# Patient Record
Sex: Female | Born: 1985 | Race: Black or African American | Hispanic: No | Marital: Single | Smoking: Never smoker
Health system: Southern US, Community
[De-identification: ages and names within clinical notes are randomized; demographics above are authoritative.]

## PROBLEM LIST (undated history)

## (undated) ENCOUNTER — Inpatient Hospital Stay (HOSPITAL_COMMUNITY): Payer: Self-pay

## (undated) DIAGNOSIS — D649 Anemia, unspecified: Secondary | ICD-10-CM

## (undated) DIAGNOSIS — F32A Depression, unspecified: Secondary | ICD-10-CM

## (undated) DIAGNOSIS — Z789 Other specified health status: Secondary | ICD-10-CM

## (undated) DIAGNOSIS — K8020 Calculus of gallbladder without cholecystitis without obstruction: Secondary | ICD-10-CM

## (undated) DIAGNOSIS — R87619 Unspecified abnormal cytological findings in specimens from cervix uteri: Secondary | ICD-10-CM

## (undated) DIAGNOSIS — G43909 Migraine, unspecified, not intractable, without status migrainosus: Secondary | ICD-10-CM

## (undated) DIAGNOSIS — F419 Anxiety disorder, unspecified: Secondary | ICD-10-CM

## (undated) HISTORY — DX: Depression, unspecified: F32.A

## (undated) HISTORY — PX: WISDOM TOOTH EXTRACTION: SHX21

## (undated) HISTORY — DX: Unspecified abnormal cytological findings in specimens from cervix uteri: R87.619

## (undated) HISTORY — PX: THYROIDECTOMY, PARTIAL: SHX18

## (undated) HISTORY — DX: Anxiety disorder, unspecified: F41.9

## (undated) HISTORY — PX: GASTRIC BYPASS: SHX52

## (undated) HISTORY — DX: Migraine, unspecified, not intractable, without status migrainosus: G43.909

## (undated) HISTORY — DX: Anemia, unspecified: D64.9

## (undated) HISTORY — PX: DILATION AND CURETTAGE OF UTERUS: SHX78

---

## 2004-02-18 ENCOUNTER — Emergency Department: Payer: Self-pay | Admitting: Emergency Medicine

## 2004-03-26 ENCOUNTER — Emergency Department: Payer: Self-pay | Admitting: Emergency Medicine

## 2004-03-27 ENCOUNTER — Ambulatory Visit: Payer: Self-pay | Admitting: Emergency Medicine

## 2004-08-05 ENCOUNTER — Emergency Department: Payer: Self-pay | Admitting: Emergency Medicine

## 2004-09-12 ENCOUNTER — Emergency Department: Payer: Self-pay | Admitting: General Practice

## 2004-10-21 ENCOUNTER — Emergency Department: Payer: Self-pay | Admitting: Emergency Medicine

## 2005-02-09 ENCOUNTER — Emergency Department: Payer: Self-pay | Admitting: Emergency Medicine

## 2005-10-11 ENCOUNTER — Emergency Department: Payer: Self-pay | Admitting: Emergency Medicine

## 2005-12-10 ENCOUNTER — Emergency Department: Payer: Self-pay | Admitting: Emergency Medicine

## 2006-01-06 ENCOUNTER — Observation Stay: Payer: Self-pay | Admitting: Obstetrics and Gynecology

## 2006-01-07 ENCOUNTER — Observation Stay: Payer: Self-pay

## 2006-01-19 ENCOUNTER — Ambulatory Visit: Payer: Self-pay

## 2006-02-16 ENCOUNTER — Ambulatory Visit: Payer: Self-pay | Admitting: Otolaryngology

## 2006-05-20 ENCOUNTER — Observation Stay: Payer: Self-pay

## 2006-05-25 ENCOUNTER — Observation Stay: Payer: Self-pay | Admitting: Obstetrics and Gynecology

## 2006-05-26 ENCOUNTER — Ambulatory Visit: Payer: Self-pay | Admitting: *Deleted

## 2006-05-26 ENCOUNTER — Inpatient Hospital Stay: Payer: Self-pay | Admitting: Certified Nurse Midwife

## 2006-05-26 ENCOUNTER — Inpatient Hospital Stay (HOSPITAL_COMMUNITY): Admission: AD | Admit: 2006-05-26 | Discharge: 2006-05-26 | Payer: Self-pay | Admitting: Obstetrics and Gynecology

## 2006-10-07 ENCOUNTER — Ambulatory Visit: Payer: Self-pay | Admitting: Otolaryngology

## 2006-10-13 ENCOUNTER — Emergency Department: Payer: Self-pay

## 2006-12-05 ENCOUNTER — Emergency Department: Payer: Self-pay | Admitting: Emergency Medicine

## 2007-05-15 ENCOUNTER — Emergency Department: Payer: Self-pay | Admitting: Internal Medicine

## 2007-09-09 ENCOUNTER — Emergency Department (HOSPITAL_COMMUNITY): Admission: EM | Admit: 2007-09-09 | Discharge: 2007-09-10 | Payer: Self-pay | Admitting: Emergency Medicine

## 2007-11-01 ENCOUNTER — Emergency Department (HOSPITAL_COMMUNITY): Admission: EM | Admit: 2007-11-01 | Discharge: 2007-11-01 | Payer: Self-pay | Admitting: Emergency Medicine

## 2007-11-12 ENCOUNTER — Emergency Department (HOSPITAL_COMMUNITY): Admission: EM | Admit: 2007-11-12 | Discharge: 2007-11-12 | Payer: Self-pay | Admitting: Emergency Medicine

## 2008-10-31 ENCOUNTER — Emergency Department (HOSPITAL_COMMUNITY): Admission: EM | Admit: 2008-10-31 | Discharge: 2008-10-31 | Payer: Self-pay | Admitting: Emergency Medicine

## 2010-08-11 ENCOUNTER — Inpatient Hospital Stay (HOSPITAL_COMMUNITY)
Admission: AD | Admit: 2010-08-11 | Discharge: 2010-08-11 | Disposition: A | Discharge status: Home / Self Care | Payer: Self-pay | Source: Ambulatory Visit | Attending: Obstetrics & Gynecology | Admitting: Obstetrics & Gynecology

## 2010-08-11 ENCOUNTER — Inpatient Hospital Stay (HOSPITAL_COMMUNITY): Payer: Self-pay

## 2010-08-11 DIAGNOSIS — B373 Candidiasis of vulva and vagina: Secondary | ICD-10-CM

## 2010-08-11 DIAGNOSIS — R109 Unspecified abdominal pain: Secondary | ICD-10-CM

## 2010-08-11 DIAGNOSIS — B3731 Acute candidiasis of vulva and vagina: Secondary | ICD-10-CM | POA: Insufficient documentation

## 2010-08-11 DIAGNOSIS — A5901 Trichomonal vulvovaginitis: Secondary | ICD-10-CM | POA: Insufficient documentation

## 2010-08-11 LAB — URINALYSIS, ROUTINE W REFLEX MICROSCOPIC
Bilirubin Urine: NEGATIVE
Glucose, UA: NEGATIVE mg/dL
Nitrite: NEGATIVE
Specific Gravity, Urine: 1.025 (ref 1.005–1.030)
pH: 6.5 (ref 5.0–8.0)

## 2010-08-11 LAB — URINE MICROSCOPIC-ADD ON

## 2010-08-11 LAB — WET PREP, GENITAL

## 2010-08-11 LAB — POCT PREGNANCY, URINE: Preg Test, Ur: NEGATIVE

## 2010-08-11 LAB — CBC
MCH: 29.7 pg (ref 26.0–34.0)
MCHC: 34.5 g/dL (ref 30.0–36.0)
Platelets: 179 10*3/uL (ref 150–400)
RBC: 4.48 MIL/uL (ref 3.87–5.11)

## 2010-08-21 ENCOUNTER — Inpatient Hospital Stay (INDEPENDENT_AMBULATORY_CARE_PROVIDER_SITE_OTHER)
Admission: RE | Admit: 2010-08-21 | Discharge: 2010-08-21 | Disposition: A | Discharge status: Home / Self Care | Payer: Self-pay | Source: Ambulatory Visit

## 2010-08-21 DIAGNOSIS — R197 Diarrhea, unspecified: Secondary | ICD-10-CM

## 2010-08-21 LAB — POCT URINALYSIS DIP (DEVICE)
Bilirubin Urine: NEGATIVE
Ketones, ur: NEGATIVE mg/dL
pH: 5.5 (ref 5.0–8.0)

## 2010-12-10 LAB — GC/CHLAMYDIA PROBE AMP, GENITAL
Chlamydia, DNA Probe: NEGATIVE
GC Probe Amp, Genital: NEGATIVE

## 2010-12-10 LAB — WET PREP, GENITAL
Trich, Wet Prep: NONE SEEN
Yeast Wet Prep HPF POC: NONE SEEN

## 2010-12-10 LAB — POCT URINALYSIS DIP (DEVICE)
Glucose, UA: NEGATIVE
Ketones, ur: NEGATIVE
Operator id: 282151
Specific Gravity, Urine: 1.02

## 2011-05-19 ENCOUNTER — Encounter (HOSPITAL_COMMUNITY): Payer: Self-pay | Admitting: Emergency Medicine

## 2011-05-19 ENCOUNTER — Emergency Department (HOSPITAL_COMMUNITY)
Admission: EM | Admit: 2011-05-19 | Discharge: 2011-05-20 | Disposition: A | Discharge status: Home / Self Care | Payer: Medicaid Other | Attending: Emergency Medicine | Admitting: Emergency Medicine

## 2011-05-19 DIAGNOSIS — R109 Unspecified abdominal pain: Secondary | ICD-10-CM | POA: Insufficient documentation

## 2011-05-19 DIAGNOSIS — R110 Nausea: Secondary | ICD-10-CM | POA: Insufficient documentation

## 2011-05-19 DIAGNOSIS — R51 Headache: Secondary | ICD-10-CM | POA: Insufficient documentation

## 2011-05-19 NOTE — ED Notes (Signed)
PT. REPORTS HEADACHE, BODY ACHES WITH NAUSEA AND CHILLS ONSET TODAY .

## 2011-05-19 NOTE — ED Notes (Signed)
Called in all waiting rooms for patient, no answer.

## 2011-05-20 LAB — URINALYSIS, ROUTINE W REFLEX MICROSCOPIC
Glucose, UA: NEGATIVE mg/dL
Hgb urine dipstick: NEGATIVE
Ketones, ur: 15 mg/dL — AB
Protein, ur: NEGATIVE mg/dL

## 2011-05-20 MED ORDER — DIPHENHYDRAMINE HCL 50 MG/ML IJ SOLN
25.0000 mg | Freq: Once | INTRAMUSCULAR | Status: AC
  Administered 2011-05-20: 25 mg via INTRAVENOUS
  Filled 2011-05-20: qty 1

## 2011-05-20 MED ORDER — DEXAMETHASONE SODIUM PHOSPHATE 10 MG/ML IJ SOLN
10.0000 mg | Freq: Once | INTRAMUSCULAR | Status: AC
  Administered 2011-05-20: 10 mg via INTRAVENOUS
  Filled 2011-05-20: qty 1

## 2011-05-20 MED ORDER — METOCLOPRAMIDE HCL 5 MG/ML IJ SOLN: 10.0000 mg | Freq: Once | INTRAMUSCULAR | Status: DC

## 2011-05-20 NOTE — ED Notes (Signed)
Decadron 10 mg iv.  Headache almost gone before the  Med finished

## 2011-05-20 NOTE — Discharge Instructions (Signed)
You were seen and evaluated today for your symptoms of headache. You were treated for your headache with medications and IV fluids. You reported having good improvement of your symptoms at this time your providers that he may return home. Return to the emergency room if you develop return of symptoms, worsening headache, fever, chills or body aches.  Headache, General, Unknown Cause The specific cause of your headache may not have been found today. There are many causes and types of headache. A few common ones are:  Tension headache.   Migraine.   Infections (examples: dental and sinus infections).   Bone and/or joint problems in the neck or jaw.   Depression.   Eye problems.  These headaches are not life threatening.  Headaches can sometimes be diagnosed by a patient history and a physical exam. Sometimes, lab and imaging studies (such as x-ray and/or CT scan) are used to rule out more serious problems. In some cases, a spinal tap (lumbar puncture) may be requested. There are many times when your exam and tests may be normal on the first visit even when there is a serious problem causing your headaches. Because of that, it is very important to follow up with your doctor or local clinic for further evaluation. FINDING OUT THE RESULTS OF TESTS  If a radiology test was performed, a radiologist will review your results.   You will be contacted by the emergency department or your physician if any test results require a change in your treatment plan.   Not all test results may be available during your visit. If your test results are not back during the visit, make an appointment with your caregiver to find out the results. Do not assume everything is normal if you have not heard from your caregiver or the medical facility. It is important for you to follow up on all of your test results.  HOME CARE INSTRUCTIONS   Keep follow-up appointments with your caregiver, or any specialist referral.    Only take over-the-counter or prescription medicines for pain, discomfort, or fever as directed by your caregiver.   Biofeedback, massage, or other relaxation techniques may be helpful.   Ice packs or heat applied to the head and neck can be used. Do this three to four times per day, or as needed.   Call your doctor if you have any questions or concerns.   If you smoke, you should quit.  SEEK MEDICAL CARE IF:   You develop problems with medications prescribed.   You do not respond to or obtain relief from medications.   You have a change from the usual headache.   You develop nausea or vomiting.  SEEK IMMEDIATE MEDICAL CARE IF:   If your headache becomes severe.   You have an unexplained oral temperature above 102 F (38.9 C), or as your caregiver suggests.   You have a stiff neck.   You have loss of vision.   You have muscular weakness.   You have loss of muscular control.   You develop severe symptoms different from your first symptoms.   You start losing your balance or have trouble walking.   You feel faint or pass out.  MAKE SURE YOU:   Understand these instructions.   Will watch your condition.   Will get help right away if you are not doing well or get worse.  Document Released: 02/23/2005 Document Revised: 02/12/2011 Document Reviewed: 10/13/2007 [facility] Patient Information 2012 [place], [place].   RESOURCE GUIDE  Dental  Problems  Patients with Medicaid: [facility] [phone] [address]                                           [phone] [address] Phone:  [phone]                                                  Phone:  [phone]  If unable to pay or uninsured, contact:  [facility] or [facility] to become qualified for the adult dental clinic.  Chronic Pain Problems Contact [facility]  [phone] Patients need to be referred by their primary care  doctor.  Insufficient Money for Medicine Contact [employer]:  call "211" or [facility] 205-061-3189.  No Primary Care Doctor Call [facility]  ([phone] Other agencies that provide inexpensive medical care    [facility]  [phone]    [facility]  [phone]    [facility]  [phone]    [facility]  [phone]    [facility]  [phone]    [facility]  [phone]  Psychological Services [facility]  [phone] [facility]  [phone] [facility] Mental [facility]   [phone] (emergency services ([phone])  Substance Abuse Resources [facility]  [phone] [facility] (772)[phone] [facility] [phone] Floydene Flock (218) [phone] Residential & Outpatient Substance Abuse Program  518-807-1105  Abuse/Neglect [facility] Child Abuse Hotline (480) 213-3158 [facility] Child Abuse Hotline 534 641 4565 (After Hours)  Emergency Shelter [facility] 501-542-9705  Maternity Homes Room at the [place] [facility] (406)[phone] [facility] [phone]  MRSA Hotline #:   908-028-3249    [facility] Resources  [facility]     [employer]                          [facility] Dept. [address] [place]                       [address]      [address]  Blondell Reveal Phone:  [phone]                                   Phone:  [phone]                 Phone:  [phone]  [facility] Mental Health Phone:  [phone]  [facility] Child  Abuse Hotline (336) 2267982412 [phone])[phone] (After Hours)

## 2011-05-20 NOTE — ED Notes (Signed)
Iv started # 22 angiocath inserted in the lt a-c nss wo.

## 2011-05-20 NOTE — ED Provider Notes (Signed)
Medical screening examination/treatment/procedure(s) were performed by non-physician practitioner and as supervising physician I was immediately available for consultation/collaboration.  Laraya Pestka, MD 05/20/11 0735 

## 2011-05-20 NOTE — ED Notes (Signed)
Iv removed

## 2011-05-20 NOTE — ED Notes (Signed)
Benadryl 25mg  given iv

## 2011-05-20 NOTE — ED Provider Notes (Signed)
History     CSN: [id number]  Arrival date & time 05/19/11  2240   First MD Initiated Contact with Patient 05/19/11 2352      Chief Complaint  Patient presents with  . Headache     HPI  History provided by the patient. Patient is a 26 year old female with history of migraine headaches who presents with complaints of similar migraine headache that began earlier today. Patient is in typical location of past migraines on the bilateral top part of the head. Headache is made worse with bright lights. SHe has had associated with some nausea and slight abdominal ache. Patient denies having any fever or sweats. She denies any vomiting or diarrhea episodes. She denies any cough, nasal congestion, sinus pressure or sore throat. He denies any neck stiffness, rash or confusion. Symptoms are described as moderate to severe at times. SHe denies any other aggravating or alleviating factors.    History reviewed. No pertinent past medical history.  Past Surgical History  Procedure Date  . Thyroidectomy, partial     No family history on file.  History  Substance Use Topics  . Smoking status: Never Smoker   . Smokeless tobacco: Not on file  . Alcohol Use: Yes    OB History    Grav Para Term Preterm Abortions TAB SAB Ect Mult Living                  Review of Systems  Constitutional: Positive for chills. Negative for fever and diaphoresis.  HENT: Negative for congestion and sore throat.   Gastrointestinal: Positive for nausea and abdominal pain. Negative for vomiting, diarrhea and constipation.  Genitourinary: Negative for dysuria, frequency, hematuria, flank pain, vaginal bleeding and vaginal discharge.  Musculoskeletal: Positive for myalgias.  Neurological: Positive for headaches.  All other systems reviewed and are negative.    Allergies  Review of patient's allergies indicates no known allergies.  Home Medications   Current Outpatient Rx  Name Route Sig Dispense Refill  .  ACETAMINOPHEN 500 MG PO TABS Oral Take 500 mg by mouth every 6 (six) hours as needed. For pain      BP 141/89  Pulse 124  Temp(Src) 99.4 F (37.4 C) (Oral)  Resp 20  SpO2 100%  Physical Exam  Nursing note and vitals reviewed. Constitutional: She is oriented to person, place, and time. She appears well-developed and well-nourished. No distress.  HENT:  Head: Normocephalic.  Cardiovascular: Normal rate and regular rhythm.   Pulmonary/Chest: Effort normal and breath sounds normal. No respiratory distress. She has no wheezes. She has no rales.  Abdominal: Soft. She exhibits no distension. There is tenderness in the suprapubic area. There is no rigidity, no rebound, no guarding and no tenderness at McBurney's point.  Neurological: She is alert and oriented to person, place, and time. No cranial nerve deficit or sensory deficit. Gait normal.  Skin: Skin is warm and dry. No rash noted.  Psychiatric: She has a normal mood and affect. Her behavior is normal.    ED Course  Procedures   Results for orders placed during the hospital encounter of 05/19/11  POCT PREGNANCY, URINE      Component Value Range   Preg Test, Ur NEGATIVE  NEGATIVE   URINALYSIS, ROUTINE W REFLEX MICROSCOPIC      Component Value Range   Color, Urine YELLOW  YELLOW    APPearance CLEAR  CLEAR    Specific Gravity, Urine 1.024  1.005 - 1.030    pH 7.0  5.0 - 8.0    Glucose, UA NEGATIVE  NEGATIVE (mg/dL)   Hgb urine dipstick NEGATIVE  NEGATIVE    Bilirubin Urine NEGATIVE  NEGATIVE    Ketones, ur 15 (*) NEGATIVE (mg/dL)   Protein, ur NEGATIVE  NEGATIVE (mg/dL)   Urobilinogen, UA 1.0  0.0 - 1.0 (mg/dL)   Nitrite NEGATIVE  NEGATIVE    Leukocytes, UA NEGATIVE  NEGATIVE        1. Headache       MDM  12:00AM patient seen and evaluated. Patient in no acute distress.  Patients heart rate has improved with fluids. She also reports decrease pain which I suspect is also help with heart rate.  Patient with normal  UA and pregnancy. Patient continues to feel well and requesting to return home at this time. Patient given strict return precautions.      Angus Seller, [place] 05/20/11 [phone]

## 2013-10-24 ENCOUNTER — Other Ambulatory Visit: Payer: Self-pay | Admitting: Family

## 2013-10-24 ENCOUNTER — Ambulatory Visit
Admission: RE | Admit: 2013-10-24 | Discharge: 2013-10-24 | Disposition: A | Discharge status: Home / Self Care | Payer: Self-pay | Source: Ambulatory Visit | Attending: Family | Admitting: Family

## 2013-10-24 ENCOUNTER — Encounter (INDEPENDENT_AMBULATORY_CARE_PROVIDER_SITE_OTHER): Payer: Self-pay

## 2013-10-24 DIAGNOSIS — R0602 Shortness of breath: Secondary | ICD-10-CM

## 2013-12-14 ENCOUNTER — Emergency Department (HOSPITAL_COMMUNITY)
Admission: EM | Admit: 2013-12-14 | Discharge: 2013-12-15 | Disposition: A | Discharge status: Home / Self Care | Payer: Medicaid Other | Attending: Emergency Medicine | Admitting: Emergency Medicine

## 2013-12-14 ENCOUNTER — Encounter (HOSPITAL_COMMUNITY): Payer: Self-pay | Admitting: Emergency Medicine

## 2013-12-14 DIAGNOSIS — Z3202 Encounter for pregnancy test, result negative: Secondary | ICD-10-CM | POA: Diagnosis not present

## 2013-12-14 DIAGNOSIS — L03116 Cellulitis of left lower limb: Secondary | ICD-10-CM | POA: Insufficient documentation

## 2013-12-14 DIAGNOSIS — L03115 Cellulitis of right lower limb: Secondary | ICD-10-CM

## 2013-12-14 DIAGNOSIS — B9689 Other specified bacterial agents as the cause of diseases classified elsewhere: Secondary | ICD-10-CM

## 2013-12-14 DIAGNOSIS — N760 Acute vaginitis: Secondary | ICD-10-CM

## 2013-12-14 DIAGNOSIS — R1030 Lower abdominal pain, unspecified: Secondary | ICD-10-CM | POA: Diagnosis present

## 2013-12-14 LAB — COMPREHENSIVE METABOLIC PANEL
ALT: 20 U/L (ref 0–35)
AST: 16 U/L (ref 0–37)
Albumin: 3.8 g/dL (ref 3.5–5.2)
Alkaline Phosphatase: 46 U/L (ref 39–117)
Anion gap: 10 (ref 5–15)
BILIRUBIN TOTAL: 0.3 mg/dL (ref 0.3–1.2)
BUN: 12 mg/dL (ref 6–23)
CO2: 29 meq/L (ref 19–32)
CREATININE: 0.98 mg/dL (ref 0.50–1.10)
Calcium: 9.5 mg/dL (ref 8.4–10.5)
Chloride: 101 mEq/L (ref 96–112)
GFR calc Af Amer: 89 mL/min — ABNORMAL LOW (ref >90)
GFR, EST NON AFRICAN AMERICAN: 77 mL/min — AB (ref >90)
GLUCOSE: 92 mg/dL (ref 70–99)
Potassium: 4.1 mEq/L (ref 3.7–5.3)
SODIUM: 140 meq/L (ref 137–147)
Total Protein: 7.3 g/dL (ref 6.0–8.3)

## 2013-12-14 LAB — CBC WITH DIFFERENTIAL/PLATELET
Basophils Absolute: 0 10*3/uL (ref 0.0–0.1)
Basophils Relative: 0 % (ref 0–1)
Eosinophils Absolute: 0.1 10*3/uL (ref 0.0–0.7)
Eosinophils Relative: 1 % (ref 0–5)
HEMATOCRIT: 41.3 % (ref 36.0–46.0)
Hemoglobin: 13.8 g/dL (ref 12.0–15.0)
LYMPHS ABS: 3.3 10*3/uL (ref 0.7–4.0)
LYMPHS PCT: 34 % (ref 12–46)
MCH: 29.2 pg (ref 26.0–34.0)
MCHC: 33.4 g/dL (ref 30.0–36.0)
MCV: 87.5 fL (ref 78.0–100.0)
MONO ABS: 0.6 10*3/uL (ref 0.1–1.0)
Monocytes Relative: 6 % (ref 3–12)
Neutro Abs: 5.6 10*3/uL (ref 1.7–7.7)
Neutrophils Relative %: 59 % (ref 43–77)
Platelets: 215 10*3/uL (ref 150–400)
RBC: 4.72 MIL/uL (ref 3.87–5.11)
RDW: 12.4 % (ref 11.5–15.5)
WBC: 9.6 10*3/uL (ref 4.0–10.5)

## 2013-12-14 LAB — LIPASE, BLOOD: Lipase: 31 U/L (ref 11–59)

## 2013-12-14 NOTE — ED Notes (Signed)
Pt states she is having lower abd pain that started yesterday  Pt describes the pain as a pressure  Pt states she is nauseated and had vomiting yesterday but only dry heaves today  Denies urinary or GYN problems  Pt states she also has a rash on her right leg that she noticed on Wednesday

## 2013-12-15 LAB — URINALYSIS, ROUTINE W REFLEX MICROSCOPIC
BILIRUBIN URINE: NEGATIVE
Glucose, UA: NEGATIVE mg/dL
HGB URINE DIPSTICK: NEGATIVE
Ketones, ur: NEGATIVE mg/dL
Leukocytes, UA: NEGATIVE
Nitrite: NEGATIVE
PH: 5 (ref 5.0–8.0)
Protein, ur: NEGATIVE mg/dL
Specific Gravity, Urine: 1.021 (ref 1.005–1.030)
Urobilinogen, UA: 0.2 mg/dL (ref 0.0–1.0)

## 2013-12-15 LAB — POC URINE PREG, ED: Preg Test, Ur: NEGATIVE

## 2013-12-15 LAB — WET PREP, GENITAL
TRICH WET PREP: NONE SEEN
Yeast Wet Prep HPF POC: NONE SEEN

## 2013-12-15 MED ORDER — ACETAMINOPHEN 500 MG PO TABS
1000.0000 mg | ORAL_TABLET | Freq: Once | ORAL | Status: AC
  Administered 2013-12-15: 1000 mg via ORAL
  Filled 2013-12-15: qty 2

## 2013-12-15 MED ORDER — METRONIDAZOLE 500 MG PO TABS: 500.0000 mg | ORAL_TABLET | Freq: Two times a day (BID) | ORAL | Status: DC

## 2013-12-15 MED ORDER — SULFAMETHOXAZOLE-TRIMETHOPRIM 800-160 MG PO TABS: 1.0000 | ORAL_TABLET | Freq: Two times a day (BID) | ORAL | Status: AC

## 2013-12-15 MED ORDER — IBUPROFEN 800 MG PO TABS: 800.0000 mg | ORAL_TABLET | Freq: Three times a day (TID) | ORAL | Status: DC | PRN

## 2013-12-15 NOTE — ED Provider Notes (Signed)
TIME SEEN: 1:40 AM  CHIEF COMPLAINT: Abdominal pain, nausea and vomiting, rash  HPI: Patient is a 28 y.o. F G2P1A1 with no significant past medical history who presents emergency room with a lower abdominal pressure that started yesterday. She also describes the pain as sharp without radiation. Aggravating or leading factors. She did have nausea and vomiting yesterday. No diarrhea. No vaginal bleeding or discharge. Last menstrual period was several months ago but she does have a Mirena IUD. She is sexually active with one female partner. She has had a history of Chlamydia when she was in high school that was treated.    She is also complaining of a rash to her right lateral thigh that she noticed yesterday. No history of new exposures to soaps or lotions or detergents. No new medications. It is not pruritic. Denies any insect bite to this area. No other rash.  ROS: See HPI Constitutional: no fever  Eyes: no drainage  ENT: no runny nose   Cardiovascular:  no chest pain  Resp: no SOB  GI: no vomiting GU: no dysuria Integumentary: no rash  Allergy: no hives  Musculoskeletal: no leg swelling  Neurological: no slurred speech ROS otherwise negative  PAST MEDICAL HISTORY/PAST SURGICAL HISTORY:  History reviewed. No pertinent past medical history.  MEDICATIONS:  Prior to Admission medications   Medication Sig Start Date End Date Taking? Authorizing Provider  ibuprofen (ADVIL,MOTRIN) 200 MG tablet Take 400 mg by mouth every 6 (six) hours as needed for moderate pain.   Yes Historical Provider, MD  levonorgestrel (MIRENA) 20 MCG/24HR IUD 1 each by Intrauterine route once.    Historical Provider, MD    ALLERGIES:  No Known Allergies  SOCIAL HISTORY:  History  Substance Use Topics  . Smoking status: Never Smoker   . Smokeless tobacco: Not on file  . Alcohol Use: Yes     Comment: social    FAMILY HISTORY: Family History  Problem Relation Age of Onset  . Hypertension Other   .  Diabetes Other     EXAM: BP 131/88  Pulse 71  Temp(Src) 98.8 F (37.1 C) (Oral)  Resp 16  Ht 5\' 4"  (1.626 m)  Wt 230 lb (104.327 kg)  BMI 39.46 kg/m2  SpO2 100% CONSTITUTIONAL: Alert and oriented and responds appropriately to questions. Well-appearing; well-nourished, nontoxic, smiling, pleasant HEAD: Normocephalic EYES: Conjunctivae clear, PERRL ENT: normal nose; no rhinorrhea; moist mucous membranes; pharynx without lesions noted NECK: Supple, no meningismus, no LAD  CARD: RRR; S1 and S2 appreciated; no murmurs, no clicks, no rubs, no gallops RESP: Normal chest excursion without splinting or tachypnea; breath sounds clear and equal bilaterally; no wheezes, no rhonchi, no rales,  ABD/GI: Normal bowel sounds; non-distended; soft, non-tender, no rebound, no guarding GU:  Normal external genitalia, no vaginal bleeding, minimal amount of thin white vaginal discharge, no cervical motion tenderness, no adnexal tenderness or fullness BACK:  The back appears normal and is non-tender to palpation, there is no CVA tenderness EXT: Normal ROM in all joints; non-tender to palpation; no edema; normal capillary refill; no cyanosis    SKIN: Normal color for age and race; warm; 3 small papules without drainage with surrounding erythema and warmth without induration or fluctuance to the right lateral thigh NEURO: Moves all extremities equally PSYCH: The patient's mood and manner are appropriate. Grooming and personal hygiene are appropriate.  MEDICAL DECISION MAKING: Patient here with what appears to be cellulitis to her right lateral thigh.  She has no systemic symptoms other  than nausea and vomiting yesterday. He has not had any vomiting today. She is tolerating by mouth. Will treat with Bactrim for one week.   She is also complaining of lower abdominal pain. Her abdominal exam is benign. We'll perform pelvic exam with cultures. Urinalysis and urine pregnancy test pending. Low suspicion for  appendicitis , Torsion, ectopic, TOA.  UTI as possible. We'll give Tylenol for pain. She denies wanting antibiotics at this time.   ED PROGRESS: Patient's labs are completely normal. Urine shows no sign of infection. UPT negative. Pelvic exam unremarkable. Her wet prep is positive for clue cells. We'll treat with Flagyl for one week. I feel she is safe to be discharged home. Discussed return precautions. Patient verbalizes understanding and is comfortable with plan.     Matthews, DO 12/15/13 [phone]

## 2013-12-15 NOTE — Discharge Instructions (Signed)
Bacterial Vaginosis Bacterial vaginosis is a vaginal infection that occurs when the normal balance of bacteria in the vagina is disrupted. It results from an overgrowth of certain bacteria. This is the most common vaginal infection in women of childbearing age. Treatment is important to prevent complications, especially in pregnant women, as it can cause a premature delivery. CAUSES  Bacterial vaginosis is caused by an increase in harmful bacteria that are normally present in smaller amounts in the vagina. Several different kinds of bacteria can cause bacterial vaginosis. However, the reason that the condition develops is not fully understood. RISK FACTORS Certain activities or behaviors can put you at an increased risk of developing bacterial vaginosis, including:  Having a new sex partner or multiple sex partners.  Douching.  Using an intrauterine device (IUD) for contraception. Women do not get bacterial vaginosis from toilet seats, bedding, swimming pools, or contact with objects around them. SIGNS AND SYMPTOMS  Some women with bacterial vaginosis have no signs or symptoms. Common symptoms include:  Grey vaginal discharge.  A fishlike odor with discharge, especially after sexual intercourse.  Itching or burning of the vagina and vulva.  Burning or pain with urination. DIAGNOSIS  Your health care provider will take a medical history and examine the vagina for signs of bacterial vaginosis. A sample of vaginal fluid may be taken. Your health care provider will look at this sample under a microscope to check for bacteria and abnormal cells. A vaginal pH test may also be done.  TREATMENT  Bacterial vaginosis may be treated with antibiotic medicines. These may be given in the form of a pill or a vaginal cream. A second round of antibiotics may be prescribed if the condition comes back after treatment.  HOME CARE INSTRUCTIONS   Only take over-the-counter or prescription medicines as  directed by your health care provider.  If antibiotic medicine was prescribed, take it as directed. Make sure you finish it even if you start to feel better.  Do not have sex until treatment is completed.  Tell all sexual partners that you have a vaginal infection. They should see their health care provider and be treated if they have problems, such as a mild rash or itching.  Practice safe sex by using condoms and only having one sex partner. SEEK MEDICAL CARE IF:   Your symptoms are not improving after 3 days of treatment.  You have increased discharge or pain.  You have a fever. MAKE SURE YOU:   Understand these instructions.  Will watch your condition.  Will get help right away if you are not doing well or get worse. FOR MORE INFORMATION  Centers for Disease Control and Prevention, Division of STD Prevention: [employer]raud[employer] American Sexual Health Association (ASHA): www.ashastd.org  Document Released: 02/23/2005 Document Revised: 12/14/2012 Document Reviewed: 10/05/2012 [facility] Patient Information 2015 [place], [place]. This information is not intended to replace advice given to you by your health care provider. Make sure you discuss any questions you have with your health care provider.  Cellulitis Cellulitis is an infection of the skin and the tissue beneath it. The infected area is usually red and tender. Cellulitis occurs most often in the arms and lower legs.  CAUSES  Cellulitis is caused by bacteria that enter the skin through cracks or cuts in the skin. The most common types of bacteria that cause cellulitis are staphylococci and streptococci. SIGNS AND SYMPTOMS   Redness and warmth.  Swelling.  Tenderness or pain.  Fever. DIAGNOSIS  Your health care  provider can usually determine what is wrong based on a physical exam. Blood tests may also be done. TREATMENT  Treatment usually involves taking an antibiotic medicine. HOME CARE INSTRUCTIONS   Take your  antibiotic medicine as directed by your health care provider. Finish the antibiotic even if you start to feel better.  Keep the infected arm or leg elevated to reduce swelling.  Apply a warm cloth to the affected area up to 4 times per day to relieve pain.  Take medicines only as directed by your health care provider.  Keep all follow-up visits as directed by your health care provider. SEEK MEDICAL CARE IF:   You notice red streaks coming from the infected area.  Your red area gets larger or turns dark in color.  Your bone or joint underneath the infected area becomes painful after the skin has healed.  Your infection returns in the same area or another area.  You notice a swollen bump in the infected area.  You develop new symptoms.  You have a fever. SEEK IMMEDIATE MEDICAL CARE IF:   You feel very sleepy.  You develop vomiting or diarrhea.  You have a general ill feeling (malaise) with muscle aches and pains. MAKE SURE YOU:   Understand these instructions.  Will watch your condition.  Will get help right away if you are not doing well or get worse. Document Released: 12/03/2004 Document Revised: 07/10/2013 Document Reviewed: 05/11/2011 [facility] Patient Information 2015 [place], [place]. This information is not intended to replace advice given to you by your health care provider. Make sure you discuss any questions you have with your health care provider.

## 2013-12-16 LAB — GC/CHLAMYDIA PROBE AMP
CT PROBE, AMP APTIMA: NEGATIVE
GC PROBE AMP APTIMA: NEGATIVE

## 2013-12-23 ENCOUNTER — Emergency Department (HOSPITAL_COMMUNITY)
Admission: EM | Admit: 2013-12-23 | Discharge: 2013-12-23 | Disposition: A | Discharge status: Home / Self Care | Payer: Medicaid Other | Source: Home / Self Care | Attending: Family Medicine | Admitting: Family Medicine

## 2013-12-23 ENCOUNTER — Encounter (HOSPITAL_COMMUNITY): Payer: Self-pay | Admitting: Emergency Medicine

## 2013-12-23 DIAGNOSIS — T7840XA Allergy, unspecified, initial encounter: Secondary | ICD-10-CM

## 2013-12-23 DIAGNOSIS — Z889 Allergy status to unspecified drugs, medicaments and biological substances status: Secondary | ICD-10-CM

## 2013-12-23 MED ORDER — TRIAMCINOLONE ACETONIDE 40 MG/ML IJ SUSP
40.0000 mg | Freq: Once | INTRAMUSCULAR | Status: AC
  Administered 2013-12-23: 40 mg via INTRAMUSCULAR

## 2013-12-23 MED ORDER — TRIAMCINOLONE ACETONIDE 40 MG/ML IJ SUSP
INTRAMUSCULAR | Status: AC
  Filled 2013-12-23: qty 1

## 2013-12-23 NOTE — Discharge Instructions (Signed)
No more sulfa medicine, use benadryl for rash and itching, return to ER if further problems or see your doctor.

## 2013-12-23 NOTE — ED Notes (Signed)
Pt   Seen    Er    9  Days  Ago  For  BV    And cellulitis       Present  Today  With  A rash  Red  Raised  Lesions       That  Started    yest          No  Angioedema        Sitting  Upright on  Exam table       Speaking  In   Complete  sentances  Appearing no  Acute  Distress

## 2013-12-23 NOTE — ED Provider Notes (Signed)
CSN: [id number]     Arrival date & time 12/23/13  1508 History   First MD Initiated Contact with Patient 12/23/13 1531     Chief Complaint  Patient presents with  . Rash   (Consider location/radiation/quality/duration/timing/severity/associated sxs/prior Treatment) Patient is a 28 y.o. female presenting with rash. The history is provided by the patient.  Rash Location:  Full body Quality: dryness, itchiness and redness   Severity:  Mild Onset quality:  Sudden Duration:  1 day Progression:  Spreading Chronicity:  New Context: medications   Context comment:  Has been on septra and metronidazole for 1 weeks for bv and skin cellulitis, rash and itching today.all over. Relieved by:  None tried Worsened by:  Nothing tried Ineffective treatments:  None tried Associated symptoms: no fever, no nausea, no shortness of breath, no throat swelling, no tongue swelling and not wheezing     No past medical history on file. Past Surgical History  Procedure Laterality Date  . Thyroidectomy, partial     Family History  Problem Relation Age of Onset  . Hypertension Other   . Diabetes Other    History  Substance Use Topics  . Smoking status: Never Smoker   . Smokeless tobacco: Not on file  . Alcohol Use: Yes     Comment: social   OB History   Grav Para Term Preterm Abortions TAB SAB Ect Mult Living                 Review of Systems  Constitutional: Negative.  Negative for fever.  Respiratory: Negative for shortness of breath and wheezing.   Cardiovascular: Negative for palpitations and leg swelling.  Gastrointestinal: Negative for nausea.  Skin: Positive for rash.    Allergies  Review of patient's allergies indicates no known allergies.  Home Medications   Prior to Admission medications   Medication Sig Start Date End Date Taking? Authorizing Provider  Sulfamethoxazole-Trimethoprim (SEPTRA PO) Take by mouth.   Yes Historical Provider, MD  ibuprofen (ADVIL,MOTRIN) 200 MG  tablet Take 400 mg by mouth every 6 (six) hours as needed for moderate pain.    Historical Provider, MD  ibuprofen (ADVIL,MOTRIN) 800 MG tablet Take 1 tablet (800 mg total) by mouth every 8 (eight) hours as needed for mild pain. 12/15/13   Kristen N Ward, DO  levonorgestrel (MIRENA) 20 MCG/24HR IUD 1 each by Intrauterine route once.    Historical Provider, MD  metroNIDAZOLE (FLAGYL) 500 MG tablet Take 1 tablet (500 mg total) by mouth 2 (two) times daily. Do not drink alcohol with this medication. 12/15/13   Kristen N Ward, DO   BP 146/85  Pulse 76  Temp(Src) 98.2 F (36.8 C) (Oral)  Resp 18  SpO2 97% Physical Exam  Nursing note and vitals reviewed. Constitutional: She is oriented to person, place, and time. She appears well-developed and well-nourished. No distress.  HENT:  Mouth/Throat: Oropharynx is clear and moist.  Neck: Normal range of motion.  Cardiovascular: Regular rhythm, normal heart sounds and intact distal pulses.   Pulmonary/Chest: Effort normal and breath sounds normal. She has no wheezes. She has no rales.  Neurological: She is alert and oriented to person, place, and time.  Skin: Skin is warm and dry.  Pruritis and diffuse hives.    ED Course  Procedures (including critical care time) Labs Review Labs Reviewed - No data to display  Imaging Review No results found.   MDM   1. Allergic reaction caused by a drug  Billy Fischer, MD 12/23/13 [phone]

## 2014-01-01 ENCOUNTER — Encounter (HOSPITAL_COMMUNITY): Payer: Self-pay

## 2014-01-01 ENCOUNTER — Inpatient Hospital Stay (HOSPITAL_COMMUNITY)
Admission: AD | Admit: 2014-01-01 | Discharge: 2014-01-02 | Disposition: A | Discharge status: Home / Self Care | Payer: Medicaid Other | Source: Ambulatory Visit | Attending: Obstetrics and Gynecology | Admitting: Obstetrics and Gynecology

## 2014-01-01 DIAGNOSIS — N898 Other specified noninflammatory disorders of vagina: Secondary | ICD-10-CM | POA: Diagnosis present

## 2014-01-01 LAB — URINALYSIS, ROUTINE W REFLEX MICROSCOPIC
BILIRUBIN URINE: NEGATIVE
Glucose, UA: NEGATIVE mg/dL
Hgb urine dipstick: NEGATIVE
KETONES UR: NEGATIVE mg/dL
LEUKOCYTES UA: NEGATIVE
NITRITE: NEGATIVE
PROTEIN: NEGATIVE mg/dL
Specific Gravity, Urine: 1.025 (ref 1.005–1.030)
UROBILINOGEN UA: 0.2 mg/dL (ref 0.0–1.0)
pH: 6.5 (ref 5.0–8.0)

## 2014-01-01 LAB — POCT PREGNANCY, URINE: Preg Test, Ur: NEGATIVE

## 2014-01-01 NOTE — MAU Note (Signed)
Having milky vaginal discharge and vaginal itching since Sunday.  Was recently treated for bacterial vaginosis and had to take an antibiotic for a spider bite.  Had to stop taking both meds due to hives.

## 2014-01-02 DIAGNOSIS — N898 Other specified noninflammatory disorders of vagina: Secondary | ICD-10-CM

## 2014-01-02 LAB — WET PREP, GENITAL
Clue Cells Wet Prep HPF POC: NONE SEEN
Trich, Wet Prep: NONE SEEN
WBC, Wet Prep HPF POC: NONE SEEN
Yeast Wet Prep HPF POC: NONE SEEN

## 2014-01-02 NOTE — MAU Provider Note (Signed)
History     CSN: [id number]  Arrival date and time: 01/01/14 2310   First Provider Initiated Contact with Patient 01/02/14 0020      Chief Complaint  Patient presents with  . Vaginal Discharge   Vaginal Discharge The patient's primary symptoms include a vaginal discharge.   Dana Garcia is a 28 y.o. [id number] who presents today with vaginal discharge x 1 week. She states that it has also been itchy. She was recently treated for BV and a spider bite with antibiotics.   History reviewed. No pertinent past medical history.  Past Surgical History  Procedure Laterality Date  . Thyroidectomy, partial      Family History  Problem Relation Age of Onset  . Hypertension Other   . Diabetes Other     History  Substance Use Topics  . Smoking status: Never Smoker   . Smokeless tobacco: Never Used  . Alcohol Use: Yes     Comment: social    Allergies:  Allergies  Allergen Reactions  . Flagyl [Metronidazole] Hives  . Septra [Sulfamethoxazole-Trimethoprim] Hives    Prescriptions prior to admission  Medication Sig Dispense Refill  . ibuprofen (ADVIL,MOTRIN) 200 MG tablet Take 400 mg by mouth every 6 (six) hours as needed for moderate pain.      Marland Kitchen ibuprofen (ADVIL,MOTRIN) 800 MG tablet Take 1 tablet (800 mg total) by mouth every 8 (eight) hours as needed for mild pain.  30 tablet  0  . levonorgestrel (MIRENA) 20 MCG/24HR IUD 1 each by Intrauterine route once.      . metroNIDAZOLE (FLAGYL) 500 MG tablet Take 1 tablet (500 mg total) by mouth 2 (two) times daily. Do not drink alcohol with this medication.  14 tablet  0  . Sulfamethoxazole-Trimethoprim (SEPTRA PO) Take by mouth.        Review of Systems  Genitourinary: Positive for vaginal discharge.   Physical Exam   There were no vitals taken for this visit.  Physical Exam  Nursing note and vitals reviewed. Constitutional: She is oriented to person, place, and time. She appears well-developed and well-nourished. No  distress.  Cardiovascular: Normal rate.   Respiratory: Effort normal.  GI: Soft. There is no tenderness. There is no rebound.  Genitourinary:   External: no lesion Vagina: small amount of white discharge Cervix: pink, smooth, no CMT Uterus: NSSC Adnexa: NT   Neurological: She is alert and oriented to person, place, and time.  Skin: Skin is warm and dry.  Psychiatric: She has a normal mood and affect.    MAU Course  Procedures  Results for orders placed during the hospital encounter of 01/01/14 (from the past 24 hour(s))  URINALYSIS, ROUTINE W REFLEX MICROSCOPIC     Status: None   Collection Time    01/01/14 11:27 PM      Result Value Ref Range   Color, Urine YELLOW  YELLOW   APPearance CLEAR  CLEAR   Specific Gravity, Urine 1.025  1.005 - 1.030   pH 6.5  5.0 - 8.0   Glucose, UA NEGATIVE  NEGATIVE mg/dL   Hgb urine dipstick NEGATIVE  NEGATIVE   Bilirubin Urine NEGATIVE  NEGATIVE   Ketones, ur NEGATIVE  NEGATIVE mg/dL   Protein, ur NEGATIVE  NEGATIVE mg/dL   Urobilinogen, UA 0.2  0.0 - 1.0 mg/dL   Nitrite NEGATIVE  NEGATIVE   Leukocytes, UA NEGATIVE  NEGATIVE  POCT PREGNANCY, URINE     Status: None   Collection Time    01/01/14 11:46  PM      Result Value Ref Range   Preg Test, Ur NEGATIVE  NEGATIVE  WET PREP, GENITAL     Status: None   Collection Time    01/02/14 12:20 AM      Result Value Ref Range   Yeast Wet Prep HPF POC NONE SEEN  NONE SEEN   Trich, Wet Prep NONE SEEN  NONE SEEN   Clue Cells Wet Prep HPF POC NONE SEEN  NONE SEEN   WBC, Wet Prep HPF POC NONE SEEN  NONE SEEN     Assessment and Plan   1. Vaginal discharge    Recommend Gynecort OTC for itching  FU with the health department as needed  Follow-up Information   Follow up with [facility] [place]. (As needed)    Contact information:   [place] [id number] [phone]      Mathis Bud 01/02/2014, 12:29 AM

## 2014-01-02 NOTE — MAU Provider Note (Signed)
.  Attestation of Attending Supervision of Advanced Practitioner (CNM/NP): Evaluation and management procedures were performed by the Advanced Practitioner under my supervision and collaboration.  I have reviewed the Advanced Practitioner's note and chart, and I agree with the management and plan.  Desmond Tufano 01/02/2014 7:01 AM

## 2014-01-02 NOTE — Discharge Instructions (Signed)
May use over the counter Gynecort for itching

## 2014-01-08 ENCOUNTER — Encounter (HOSPITAL_COMMUNITY): Payer: Self-pay

## 2014-05-07 ENCOUNTER — Emergency Department (HOSPITAL_COMMUNITY)
Admission: EM | Admit: 2014-05-07 | Discharge: 2014-05-07 | Disposition: A | Discharge status: Home / Self Care | Payer: Medicaid Other | Attending: Emergency Medicine | Admitting: Emergency Medicine

## 2014-05-07 ENCOUNTER — Encounter (HOSPITAL_COMMUNITY): Payer: Self-pay | Admitting: Emergency Medicine

## 2014-05-07 ENCOUNTER — Emergency Department (HOSPITAL_COMMUNITY): Payer: Medicaid Other

## 2014-05-07 DIAGNOSIS — M79672 Pain in left foot: Secondary | ICD-10-CM

## 2014-05-07 DIAGNOSIS — M7989 Other specified soft tissue disorders: Secondary | ICD-10-CM | POA: Diagnosis not present

## 2014-05-07 DIAGNOSIS — Z791 Long term (current) use of non-steroidal anti-inflammatories (NSAID): Secondary | ICD-10-CM | POA: Diagnosis not present

## 2014-05-07 MED ORDER — IBUPROFEN 800 MG PO TABS
800.0000 mg | ORAL_TABLET | Freq: Once | ORAL | Status: AC
  Administered 2014-05-07: 800 mg via ORAL
  Filled 2014-05-07: qty 1

## 2014-05-07 MED ORDER — IBUPROFEN 800 MG PO TABS: 800.0000 mg | ORAL_TABLET | Freq: Three times a day (TID) | ORAL | Status: DC

## 2014-05-07 MED ORDER — TRAMADOL HCL 50 MG PO TABS: 50.0000 mg | ORAL_TABLET | Freq: Four times a day (QID) | ORAL | Status: DC | PRN

## 2014-05-07 NOTE — ED Provider Notes (Signed)
CSN: [id number]     Arrival date & time 05/07/14  1840 History  This chart was scribed for Charlann Lange, PA, working with Hoy Morn, MD by Starleen Arms, ED Scribe. This patient was seen in room WTR5/WTR5 and the patient's care was started at 8:37 PM.   Chief Complaint  Patient presents with  . Foot Pain   The history is provided by the patient. No language interpreter was used.   HPI Comments: Dana Garcia is a 29 y.o. female who presents to the Emergency Department complaining of worsening, throbbing left foot pain and swelling onset 2 days ago without injury while the patient was at work.  Patient has not taken any medications but has used ice/heat and elevation.  Patient reports she works at the [employer] and is on her feet for long stretches of time.  She denies having any new footwear.    History reviewed. No pertinent past medical history. Past Surgical History  Procedure Laterality Date  . Thyroidectomy, partial     Family History  Problem Relation Age of Onset  . Hypertension Other   . Diabetes Other    History  Substance Use Topics  . Smoking status: Never Smoker   . Smokeless tobacco: Never Used  . Alcohol Use: Yes     Comment: social   OB History    Gravida Para Term Preterm AB TAB SAB Ectopic Multiple Living   2 1 1  1 1    1      Review of Systems  Musculoskeletal: Positive for joint swelling and arthralgias.      Allergies  Flagyl and Septra  Home Medications   Prior to Admission medications   Medication Sig Start Date End Date Taking? Authorizing Provider  ibuprofen (ADVIL,MOTRIN) 200 MG tablet Take 400 mg by mouth every 6 (six) hours as needed for moderate pain.    Historical Provider, MD  ibuprofen (ADVIL,MOTRIN) 800 MG tablet Take 1 tablet (800 mg total) by mouth every 8 (eight) hours as needed for mild pain. 12/15/13   Kristen N Ward, DO  levonorgestrel (MIRENA) 20 MCG/24HR IUD 1 each by Intrauterine route once.    Historical  Provider, MD   BP 124/74 mmHg  Pulse 68  Temp(Src) 97.9 F (36.6 C) (Oral)  Resp 16  SpO2 100% Physical Exam  Constitutional: She is oriented to person, place, and time. She appears well-developed and well-nourished. No distress.  HENT:  Head: Normocephalic and atraumatic.  Eyes: Conjunctivae and EOM are normal.  Neck: Neck supple. No tracheal deviation present.  Cardiovascular: Normal rate.   Pulmonary/Chest: Effort normal. No respiratory distress.  Musculoskeletal: Normal range of motion.  Left foot is moderately swollen dorsally.  Tender over dorsal foot second through fifth metatarsals extending to lateral ankle.  Ankle joint is stable.  No calf tenderness.  DP pulse intact.   Neurological: She is alert and oriented to person, place, and time.  Skin: Skin is warm and dry.  Psychiatric: She has a normal mood and affect. Her behavior is normal.  Nursing note and vitals reviewed.   ED Course  Procedures (including critical care time)  DIAGNOSTIC STUDIES: Oxygen Saturation is 100% on RA, normal by my interpretation.    COORDINATION OF CARE:  8:39 PM Discussed treatment plan with patient at bedside.  Patient acknowledges and agrees with plan.    Labs Review Labs Reviewed - No data to display  Imaging Review No results found.   EKG Interpretation None  MDM   Final diagnoses:  None    1. Left foot pain  Soft tissue swelling/strain of left foot. Supportive management.   I personally performed the services described in this documentation, which was scribed in my presence. The recorded information has been reviewed and is accurate.     Charlann Lange, PA-C 05/14/14 [place], MD 05/16/14 [phone]

## 2014-05-07 NOTE — Discharge Instructions (Signed)
Cryotherapy °Cryotherapy means treatment with cold. Ice or gel packs can be used to reduce both pain and swelling. Ice is the most helpful within the first 24 to 48 hours after an injury or flare-up from overusing a muscle or joint. Sprains, strains, spasms, burning pain, shooting pain, and aches can all be eased with ice. Ice can also be used when recovering from surgery. Ice is effective, has very few side effects, and is safe for most people to use. °PRECAUTIONS  °Ice is not a safe treatment option for people with: °· Raynaud phenomenon. This is a condition affecting small blood vessels in the extremities. Exposure to cold may cause your problems to return. °· Cold hypersensitivity. There are many forms of cold hypersensitivity, including: °¨ Cold urticaria. Red, itchy hives appear on the skin when the tissues begin to warm after being iced. °¨ Cold erythema. This is a red, itchy rash caused by exposure to cold. °¨ Cold hemoglobinuria. Red blood cells break down when the tissues begin to warm after being iced. The hemoglobin that carry oxygen are passed into the urine because they cannot combine with blood proteins fast enough. °· Numbness or altered sensitivity in the area being iced. °If you have any of the following conditions, do not use ice until you have discussed cryotherapy with your caregiver: °· Heart conditions, such as arrhythmia, angina, or chronic heart disease. °· High blood pressure. °· Healing wounds or open skin in the area being iced. °· Current infections. °· Rheumatoid arthritis. °· Poor circulation. °· Diabetes. °Ice slows the blood flow in the region it is applied. This is beneficial when trying to stop inflamed tissues from spreading irritating chemicals to surrounding tissues. However, if you expose your skin to cold temperatures for too long or without the proper protection, you can damage your skin or nerves. Watch for signs of skin damage due to cold. °HOME CARE INSTRUCTIONS °Follow  these tips to use ice and cold packs safely. °· Place a dry or damp towel between the ice and skin. A damp towel will cool the skin more quickly, so you may need to shorten the time that the ice is used. °· For a more rapid response, add gentle compression to the ice. °· Ice for no more than 10 to 20 minutes at a time. The bonier the area you are icing, the less time it will take to get the benefits of ice. °· Check your skin after 5 minutes to make sure there are no signs of a poor response to cold or skin damage. °· Rest 20 minutes or more between uses. °· Once your skin is numb, you can end your treatment. You can test numbness by very lightly touching your skin. The touch should be so light that you do not see the skin dimple from the pressure of your fingertip. When using ice, most people will feel these normal sensations in this order: cold, burning, aching, and numbness. °· Do not use ice on someone who cannot communicate their responses to pain, such as small children or people with dementia. °HOW TO MAKE AN ICE PACK °Ice packs are the most common way to use ice therapy. Other methods include ice massage, ice baths, and cryosprays. Muscle creams that cause a cold, tingly feeling do not offer the same benefits that ice offers and should not be used as a substitute unless recommended by your caregiver. °To make an ice pack, do one of the following: °· Place crushed ice or a   bag of frozen vegetables in a sealable plastic bag. Squeeze out the excess air. Place this bag inside another plastic bag. Slide the bag into a pillowcase or place a damp towel between your skin and the bag. °· Mix 3 parts water with 1 part rubbing alcohol. Freeze the mixture in a sealable plastic bag. When you remove the mixture from the freezer, it will be slushy. Squeeze out the excess air. Place this bag inside another plastic bag. Slide the bag into a pillowcase or place a damp towel between your skin and the bag. °SEEK MEDICAL CARE  IF: °· You develop white spots on your skin. This may give the skin a blotchy (mottled) appearance. °· Your skin turns blue or pale. °· Your skin becomes waxy or hard. °· Your swelling gets worse. °MAKE SURE YOU:  °· Understand these instructions. °· Will watch your condition. °· Will get help right away if you are not doing well or get worse. °Document Released: 10/20/2010 Document Revised: 07/10/2013 Document Reviewed: 10/20/2010 °ExitCare® Patient Information ©2015 [employer]. This information is not intended to replace advice given to you by your health care provider. Make sure you discuss any questions you have with your health care provider. °Foot Sprain °The muscles and cord like structures which attach muscle to bone (tendons) that surround the feet are made up of units. A foot sprain can occur at the weakest spot in any of these units. This condition is most often caused by injury to or overuse of the foot, as from playing contact sports, or aggravating a previous injury, or from poor conditioning, or obesity. °SYMPTOMS °· Pain with movement of the foot. °· Tenderness and swelling at the injury site. °· Loss of strength is present in moderate or severe sprains. °THE THREE GRADES OR SEVERITY OF FOOT SPRAIN ARE: °· Mild (Grade I): Slightly pulled muscle without tearing of muscle or tendon fibers or loss of strength. °· Moderate (Grade II): Tearing of fibers in a muscle, tendon, or at the attachment to bone, with small decrease in strength. °· Severe (Grade III): Rupture of the muscle-tendon-bone attachment, with separation of fibers. Severe sprain requires surgical repair. Often repeating (chronic) sprains are caused by overuse. Sudden (acute) sprains are caused by direct injury or over-use. °DIAGNOSIS  °Diagnosis of this condition is usually by your own observation. If problems continue, a caregiver may be required for further evaluation and treatment. X-rays may be required to make sure there are not  breaks in the bones (fractures) present. Continued problems may require physical therapy for treatment. °PREVENTION °· Use strength and conditioning exercises appropriate for your sport. °· Warm up properly prior to working out. °· Use athletic shoes that are made for the sport you are participating in. °· Allow adequate time for healing. Early return to activities makes repeat injury more likely, and can lead to an unstable arthritic foot that can result in prolonged disability. Mild sprains generally heal in 3 to 10 days, with moderate and severe sprains taking 2 to 10 weeks. Your caregiver can help you determine the proper time required for healing. °HOME CARE INSTRUCTIONS  °· Apply ice to the injury for 15-20 minutes, 03-04 times per day. Put the ice in a plastic bag and place a towel between the bag of ice and your skin. °· An elastic wrap (like an Ace bandage) may be used to keep swelling down. °· Keep foot above the level of the heart, or at least raised on a footstool, when   swelling and pain are present. °· Try to avoid use other than gentle range of motion while the foot is painful. Do not resume use until instructed by your caregiver. Then begin use gradually, not increasing use to the point of pain. If pain does develop, decrease use and continue the above measures, gradually increasing activities that do not cause discomfort, until you gradually achieve normal use. °· Use crutches if and as instructed, and for the length of time instructed. °· Keep injured foot and ankle wrapped between treatments. °· Massage foot and ankle for comfort and to keep swelling down. Massage from the toes up towards the knee. °· Only take over-the-counter or prescription medicines for pain, discomfort, or fever as directed by your caregiver. °SEEK IMMEDIATE MEDICAL CARE IF:  °· Your pain and swelling increase, or pain is not controlled with medications. °· You have loss of feeling in your foot or your foot turns cold or  blue. °· You develop new, unexplained symptoms, or an increase of the symptoms that brought you to your caregiver. °MAKE SURE YOU:  °· Understand these instructions. °· Will watch your condition. °· Will get help right away if you are not doing well or get worse. °Document Released: 08/15/2001 Document Revised: 05/18/2011 Document Reviewed: 10/13/2007 °ExitCare® Patient Information ©2015 [employer]. This information is not intended to replace advice given to you by your health care provider. Make sure you discuss any questions you have with your health care provider. ° °

## 2014-05-07 NOTE — ED Notes (Signed)
Pt c/o left foot pain, denies any trauma to foot. Pulses present.

## 2014-09-07 ENCOUNTER — Encounter (HOSPITAL_COMMUNITY): Payer: Self-pay | Admitting: Emergency Medicine

## 2014-09-07 ENCOUNTER — Emergency Department (HOSPITAL_COMMUNITY)
Admission: EM | Admit: 2014-09-07 | Discharge: 2014-09-07 | Disposition: A | Discharge status: Home / Self Care | Payer: Medicaid Other | Attending: Emergency Medicine | Admitting: Emergency Medicine

## 2014-09-07 DIAGNOSIS — R21 Rash and other nonspecific skin eruption: Secondary | ICD-10-CM | POA: Diagnosis present

## 2014-09-07 DIAGNOSIS — L509 Urticaria, unspecified: Secondary | ICD-10-CM | POA: Insufficient documentation

## 2014-09-07 DIAGNOSIS — Z791 Long term (current) use of non-steroidal anti-inflammatories (NSAID): Secondary | ICD-10-CM | POA: Insufficient documentation

## 2014-09-07 DIAGNOSIS — Z79899 Other long term (current) drug therapy: Secondary | ICD-10-CM | POA: Diagnosis not present

## 2014-09-07 MED ORDER — FAMOTIDINE 20 MG PO TABS: 20.0000 mg | ORAL_TABLET | Freq: Two times a day (BID) | ORAL | Status: DC

## 2014-09-07 MED ORDER — DIPHENHYDRAMINE HCL 25 MG PO TABS: 25.0000 mg | ORAL_TABLET | Freq: Four times a day (QID) | ORAL | Status: DC

## 2014-09-07 NOTE — Discharge Instructions (Signed)
Hives Hives are itchy, red, swollen areas of the skin. They can vary in size and location on your body. Hives can come and go for hours or several days (acute hives) or for several weeks (chronic hives). Hives do not spread from person to person (noncontagious). They may get worse with scratching, exercise, and emotional stress. CAUSES   Allergic reaction to food, additives, or drugs.  Infections, including the common cold.  Illness, such as vasculitis, lupus, or thyroid disease.  Exposure to sunlight, heat, or cold.  Exercise.  Stress.  Contact with chemicals. SYMPTOMS   Red or white swollen patches on the skin. The patches may change size, shape, and location quickly and repeatedly.  Itching.  Swelling of the hands, feet, and face. This may occur if hives develop deeper in the skin. DIAGNOSIS  Your caregiver can usually tell what is wrong by performing a physical exam. Skin or blood tests may also be done to determine the cause of your hives. In some cases, the cause cannot be determined. TREATMENT  Mild cases usually get better with medicines such as antihistamines. Severe cases may require an emergency epinephrine injection. If the cause of your hives is known, treatment includes avoiding that trigger.  HOME CARE INSTRUCTIONS   Avoid causes that trigger your hives.  Take antihistamines as directed by your caregiver to reduce the severity of your hives. Non-sedating or low-sedating antihistamines are usually recommended. Do not drive while taking an antihistamine.  Take any other medicines prescribed for itching as directed by your caregiver.  Wear loose-fitting clothing.  Keep all follow-up appointments as directed by your caregiver. SEEK MEDICAL CARE IF:   You have persistent or severe itching that is not relieved with medicine.  You have painful or swollen joints. SEEK IMMEDIATE MEDICAL CARE IF:   You have a fever.  Your tongue or lips are swollen.  You have  trouble breathing or swallowing.  You feel tightness in the throat or chest.  You have abdominal pain. These problems may be the first sign of a life-threatening allergic reaction. Call your local emergency services (911 in [place] MAKE SURE YOU:   Understand these instructions.  Will watch your condition.  Will get help right away if you are not doing well or get worse. Document Released: 02/23/2005 Document Revised: 02/28/2013 Document Reviewed: 05/19/2011 ExitCare Patient Information 2015 [employer]. This information is not intended to replace advice given to you by your health care provider. Make sure you discuss any questions you have with your health care provider.  

## 2014-09-07 NOTE — ED Notes (Signed)
Pt began to have red rash last night after work. Cannot think of anything she came into contact with that could cause reaction. Pt has several 1cm spots on R shoulder.

## 2014-09-07 NOTE — ED Provider Notes (Signed)
CSN: [id number]     Arrival date & time 09/07/14  1726 History   This chart was scribed for non-physician practitioner, Domenic Moras, PA-C, working with Tanna Furry, MD by Terressa Koyanagi, ED Scribe. This patient was seen in room WTR8/WTR8 and the patient's care was started at 5:38 PM.   Chief Complaint  Patient presents with  . Rash   HPI PCP: No PCP Per Patient HPI Comments: Dana Garcia is a 29 y.o. female who presents to the Emergency Department complaining of sudden onset, painful, burning, itchy, rash to the right shoulder and above the right chest onset yesterday while at work. Pt notes she had similar rash to her BUE yesterday which has now resolved. Pt reports using chamomile lotion at home without relief. Pt denies any new exposures including: new hygiene products, new detergent or other cleaning products, recent traveling, being outside, new pets. Pt further denies fever, trouble swallowing, abd pain, SOB, fever, chills, cough. Pt reports allergy to flagyl and septra.   History reviewed. No pertinent past medical history. Past Surgical History  Procedure Laterality Date  . Thyroidectomy, partial     Family History  Problem Relation Age of Onset  . Hypertension Other   . Diabetes Other    History  Substance Use Topics  . Smoking status: Never Smoker   . Smokeless tobacco: Never Used  . Alcohol Use: Yes     Comment: social   OB History    Gravida Para Term Preterm AB TAB SAB Ectopic Multiple Living   2 1 1  1 1    1      Review of Systems  Constitutional: Negative for fever and chills.  HENT: Negative for sore throat.   Respiratory: Negative for cough and shortness of breath.   Cardiovascular: Negative for chest pain.  Skin: Positive for color change and rash.   Allergies  Flagyl and Septra  Home Medications   Prior to Admission medications   Medication Sig Start Date End Date Taking? Authorizing Provider  ibuprofen (ADVIL,MOTRIN) 800 MG tablet Take 1 tablet  (800 mg total) by mouth 3 (three) times daily. 05/07/14   Charlann Lange, PA-C  levonorgestrel (MIRENA) 20 MCG/24HR IUD 1 each by Intrauterine route once.    Historical Provider, MD  minocycline (MINOCIN,DYNACIN) 50 MG capsule Take 50 mg by mouth daily.  02/26/14   Historical Provider, MD  traMADol (ULTRAM) 50 MG tablet Take 1 tablet (50 mg total) by mouth every 6 (six) hours as needed. 05/07/14   Charlann Lange, PA-C   Triage Vitals: BP 123/75 mmHg  Pulse 77  Temp(Src) 98.3 F (36.8 C) (Oral)  Resp 16  SpO2 100% Physical Exam  Constitutional: She is oriented to person, place, and time. She appears well-developed and well-nourished. No distress.  HENT:  Head: Normocephalic and atraumatic.  Eyes: Conjunctivae and EOM are normal.  Cardiovascular: Normal rate.   Pulmonary/Chest: Effort normal. No respiratory distress.  Musculoskeletal: Normal range of motion.  Neurological: She is alert and oriented to person, place, and time.  Skin: Skin is warm and dry. Rash noted. There is erythema.  Several localized skin irritation noted to right anterior chest adjacent to bra strap and right axillary region. No rash in palms of hand; no rash in soles of feet. No red streaking noted, no puss, no vesicles.   Psychiatric: She has a normal mood and affect. Her behavior is normal.  Nursing note and vitals reviewed.   ED Course  Procedures (including critical care time)  DIAGNOSTIC STUDIES: Oxygen Saturation is 100% on RA, nl by my interpretation.    COORDINATION OF CARE: 5:43 PM-patient presents with localized skin irritation and rash consistence with hives. No specific triggers identified. No systemic involvement. Discussed treatment plan which includes benadryl with pt at bedside and pt agreed to plan. Pt advised to return to ED immediately if Sx worsen including abd pain, fever, chills and/or red streaking.   Labs Review Labs Reviewed - No data to display  Imaging Review No results found.   EKG  Interpretation None      MDM   Final diagnoses:  Hives    BP 123/75 mmHg  Pulse 77  Temp(Src) 98.3 F (36.8 C) (Oral)  Resp 16  SpO2 100%  I personally performed the services described in this documentation, which was scribed in my presence. The recorded information has been reviewed and is accurate.       Domenic Moras, PA-C 09/07/14 1750  Tanna Furry, MD 09/10/14 2135

## 2015-06-03 ENCOUNTER — Emergency Department (HOSPITAL_COMMUNITY)
Admission: EM | Admit: 2015-06-03 | Discharge: 2015-06-03 | Disposition: A | Discharge status: Home / Self Care | Payer: Medicaid Other | Attending: Emergency Medicine | Admitting: Emergency Medicine

## 2015-06-03 ENCOUNTER — Encounter (HOSPITAL_COMMUNITY): Payer: Self-pay | Admitting: Emergency Medicine

## 2015-06-03 ENCOUNTER — Emergency Department (HOSPITAL_COMMUNITY): Payer: Medicaid Other

## 2015-06-03 DIAGNOSIS — H9203 Otalgia, bilateral: Secondary | ICD-10-CM | POA: Diagnosis not present

## 2015-06-03 DIAGNOSIS — R69 Illness, unspecified: Secondary | ICD-10-CM

## 2015-06-03 DIAGNOSIS — J029 Acute pharyngitis, unspecified: Secondary | ICD-10-CM | POA: Diagnosis present

## 2015-06-03 DIAGNOSIS — Z3202 Encounter for pregnancy test, result negative: Secondary | ICD-10-CM | POA: Diagnosis not present

## 2015-06-03 DIAGNOSIS — J111 Influenza due to unidentified influenza virus with other respiratory manifestations: Secondary | ICD-10-CM | POA: Insufficient documentation

## 2015-06-03 DIAGNOSIS — Z79899 Other long term (current) drug therapy: Secondary | ICD-10-CM | POA: Diagnosis not present

## 2015-06-03 LAB — POC URINE PREG, ED: Preg Test, Ur: NEGATIVE

## 2015-06-03 MED ORDER — KETOROLAC TROMETHAMINE 60 MG/2ML IM SOLN
60.0000 mg | Freq: Once | INTRAMUSCULAR | Status: AC
  Administered 2015-06-03: 60 mg via INTRAMUSCULAR
  Filled 2015-06-03: qty 2

## 2015-06-03 MED ORDER — LIDOCAINE VISCOUS 2 % MT SOLN
15.0000 mL | Freq: Once | OROMUCOSAL | Status: AC
  Administered 2015-06-03: 15 mL via OROMUCOSAL
  Filled 2015-06-03: qty 15

## 2015-06-03 NOTE — Discharge Instructions (Signed)

## 2015-06-03 NOTE — ED Notes (Signed)
MD at bedside. 

## 2015-06-03 NOTE — ED Notes (Signed)
Pt reports cough and sneezing yesterday and fever today.  Pt reports sore throat today.  Worse when swallowing.  States she works at [employer] and has been around workers that's been sneezing and coughing.

## 2015-06-03 NOTE — ED Provider Notes (Signed)
CSN: [id number]     Arrival date & time 06/03/15  [id number] History   First MD Initiated Contact with Patient 06/03/15 [phone]     Chief Complaint  Patient presents with  . Sore Throat     (Consider location/radiation/quality/duration/timing/severity/associated sxs/prior Treatment) HPI Comments: 102 fever this AM and body aches started today Cough/nasal congestion/sore throat started yesterday No SOB Coughing up green mucous Mild headache just started now Took nyquil last night, didn't help    Patient is a 30 y.o. female presenting with pharyngitis.  Sore Throat Associated symptoms include headaches. Pertinent negatives include no chest pain, no abdominal pain and no shortness of breath.    History reviewed. No pertinent past medical history. Past Surgical History  Procedure Laterality Date  . Thyroidectomy, partial     Family History  Problem Relation Age of Onset  . Hypertension Other   . Diabetes Other    Social History  Substance Use Topics  . Smoking status: Never Smoker   . Smokeless tobacco: Never Used  . Alcohol Use: Yes     Comment: social   OB History    Gravida Para Term Preterm AB TAB SAB Ectopic Multiple Living   2 1 1  1 1    1      Review of Systems  Constitutional: Positive for fever, appetite change and fatigue.  HENT: Positive for congestion, ear pain (pressure bilat) and sore throat.   Eyes: Negative for visual disturbance.  Respiratory: Positive for cough. Negative for shortness of breath.   Cardiovascular: Negative for chest pain.  Gastrointestinal: Negative for nausea, vomiting and abdominal pain.  Genitourinary: Negative for difficulty urinating.  Musculoskeletal: Negative for back pain and neck pain.  Skin: Negative for rash.  Neurological: Positive for headaches. Negative for syncope.      Allergies  Flagyl and Septra  Home Medications   Prior to Admission medications   Medication Sig Start Date End Date Taking? Authorizing Provider   escitalopram (LEXAPRO) 5 MG tablet Take 1 tablet by mouth once a day at bedtime 05/09/15  Yes Historical Provider, MD  levonorgestrel (MIRENA) 20 MCG/24HR IUD 1 each by Intrauterine route once.   Yes Historical Provider, MD  Vitamin D, Ergocalciferol, (DRISDOL) 50000 units CAPS capsule Take 1 capsule by mouth two times a week 04/04/15  Yes Historical Provider, MD   BP 122/86 mmHg  Pulse 81  Temp(Src) 98.7 F (37.1 C) (Oral)  Resp 17  SpO2 98% Physical Exam  Constitutional: She is oriented to person, place, and time. She appears well-developed and well-nourished. No distress.  HENT:  Head: Normocephalic and atraumatic.  Eyes: Conjunctivae and EOM are normal.  Neck: Normal range of motion.  Cardiovascular: Normal rate, regular rhythm, normal heart sounds and intact distal pulses.  Exam reveals no gallop and no friction rub.   No murmur heard. Pulmonary/Chest: Effort normal and breath sounds normal. No respiratory distress. She has no wheezes. She has no rales.  Abdominal: Soft. She exhibits no distension. There is no tenderness. There is no guarding.  Musculoskeletal: She exhibits no edema or tenderness.  Neurological: She is alert and oriented to person, place, and time.  Skin: Skin is warm and dry. No rash noted. She is not diaphoretic. No erythema.  Nursing note and vitals reviewed.   ED Course  Procedures (including critical care time) Labs Review Labs Reviewed  POC URINE PREG, ED    Imaging Review Dg Chest 2 View  06/03/2015  CLINICAL DATA:  Productive cough and congestion,  mid chest tightness, sore throat, fever and body aches. EXAM: CHEST  2 VIEW COMPARISON:  Chest x-ray dated 10/24/2013. FINDINGS: Cardiomediastinal silhouette is normal in size and configuration. Lungs are clear. Lung volumes are normal. No evidence of pneumonia. No pleural effusion. No pneumothorax. Osseous and soft tissue structures about the chest are unremarkable. Slight elevation of the right hemidiaphragm  is stable. IMPRESSION: No active cardiopulmonary disease.  No evidence of pneumonia. Electronically Signed   By: Franki Cabot M.D.   On: 06/03/2015 09:51   I have personally reviewed and evaluated these images and lab results as part of my medical decision-making.   EKG Interpretation None      MDM   Final diagnoses:  Influenza-like illness  30 yo female with no significant medical history presents with concern for fever, body aches, cough, sore throat.  Chest x-ray was done we show no evidence of pneumonia. There are no signs of  peritonsillar abscess, retro pharyngeal abscess, or epiglottitis. Doubt strep given cough and multiple other symptoms and no exudate. There are no signs of acute bacterial sinusitis or otitis media. She has full range of motion of the neck, no sign of meningitis.  Patient likely with viral syndrome or influenza -like illness. She is not an appropriate candidate for Tamiflu.  Discussed supportive care, including ibuprofen and Tylenol. Patient discharged in stable condition with understanding of reasons to return.   Gareth Morgan, MD 06/03/15 2127

## 2015-06-03 NOTE — ED Notes (Signed)
Pt c/o sore throat, fever, body aches, congestion, cough with green mucus, painful swallowing.

## 2016-05-18 ENCOUNTER — Emergency Department (HOSPITAL_COMMUNITY)
Admission: EM | Admit: 2016-05-18 | Discharge: 2016-05-18 | Disposition: A | Discharge status: Home / Self Care | Payer: Medicaid Other | Attending: Emergency Medicine | Admitting: Emergency Medicine

## 2016-05-18 ENCOUNTER — Encounter (HOSPITAL_COMMUNITY): Payer: Self-pay | Admitting: Emergency Medicine

## 2016-05-18 DIAGNOSIS — J111 Influenza due to unidentified influenza virus with other respiratory manifestations: Secondary | ICD-10-CM | POA: Insufficient documentation

## 2016-05-18 DIAGNOSIS — R509 Fever, unspecified: Secondary | ICD-10-CM | POA: Diagnosis present

## 2016-05-18 DIAGNOSIS — R69 Illness, unspecified: Secondary | ICD-10-CM

## 2016-05-18 DIAGNOSIS — Z79899 Other long term (current) drug therapy: Secondary | ICD-10-CM | POA: Insufficient documentation

## 2016-05-18 LAB — URINALYSIS, ROUTINE W REFLEX MICROSCOPIC
BILIRUBIN URINE: NEGATIVE
GLUCOSE, UA: NEGATIVE mg/dL
Hgb urine dipstick: NEGATIVE
KETONES UR: 5 mg/dL — AB
Leukocytes, UA: NEGATIVE
Nitrite: NEGATIVE
PH: 7 (ref 5.0–8.0)
Protein, ur: NEGATIVE mg/dL
Specific Gravity, Urine: 1.032 — ABNORMAL HIGH (ref 1.005–1.030)

## 2016-05-18 LAB — POC URINE PREG, ED: Preg Test, Ur: NEGATIVE

## 2016-05-18 MED ORDER — ONDANSETRON 4 MG PO TBDP
4.0000 mg | ORAL_TABLET | Freq: Once | ORAL | Status: AC
  Administered 2016-05-18: 4 mg via ORAL
  Filled 2016-05-18: qty 1

## 2016-05-18 MED ORDER — IBUPROFEN 800 MG PO TABS
800.0000 mg | ORAL_TABLET | Freq: Once | ORAL | Status: AC
  Administered 2016-05-18: 800 mg via ORAL
  Filled 2016-05-18: qty 1

## 2016-05-18 MED ORDER — IBUPROFEN 600 MG PO TABS: 600.0000 mg | ORAL_TABLET | Freq: Four times a day (QID) | ORAL | 0 refills | Status: DC | PRN

## 2016-05-18 MED ORDER — ACETAMINOPHEN 500 MG PO TABS: 1000.0000 mg | ORAL_TABLET | Freq: Four times a day (QID) | ORAL | 0 refills | Status: DC | PRN

## 2016-05-18 NOTE — ED Provider Notes (Signed)
[facility] DEPT Provider Note   CSN: [id number] Arrival date & time: 05/18/16  2044     History   Chief Complaint Chief Complaint  Patient presents with  . Generalized Body Aches  . Fever    HPI Dana Garcia is a 31 y.o. female.  The history is provided by the patient.  Fever   This is a new problem. The current episode started 12 to 24 hours ago. The problem occurs constantly. The problem has not changed since onset.The maximum temperature noted was 102 to 102.9 F. The temperature was taken using an oral thermometer. Associated symptoms include headaches and muscle aches. Pertinent negatives include no chest pain, no diarrhea, no vomiting, no sore throat and no cough. She has tried nothing for the symptoms. The treatment provided no relief.    History reviewed. No pertinent past medical history.  There are no active problems to display for this patient.   Past Surgical History:  Procedure Laterality Date  . THYROIDECTOMY, PARTIAL      OB History    Gravida Para Term Preterm AB Living   2 1 1   1 1    SAB TAB Ectopic Multiple Live Births     1     1       Home Medications    Prior to Admission medications   Medication Sig Start Date End Date Taking? Authorizing Provider  acetaminophen (TYLENOL) 325 MG tablet Take 650 mg by mouth every 6 (six) hours as needed for moderate pain or headache.   Yes Historical Provider, MD    Family History Family History  Problem Relation Age of Onset  . Hypertension Other   . Diabetes Other     Social History Social History  Substance Use Topics  . Smoking status: Never Smoker  . Smokeless tobacco: Never Used  . Alcohol use Yes     Comment: social     Allergies   Flagyl [metronidazole] and Septra [sulfamethoxazole-trimethoprim]   Review of Systems Review of Systems  Constitutional: Positive for fever.  HENT: Negative for sore throat.   Respiratory: Negative for cough.   Cardiovascular: Negative for  chest pain.  Gastrointestinal: Negative for diarrhea and vomiting.  Neurological: Positive for headaches.  All other systems reviewed and are negative.    Physical Exam Updated Vital Signs BP 144/88 (BP Location: Right Arm)   Pulse (!) 128   Temp 102.2 F (39 C) (Oral)   Resp 18   Ht 5\' 4"  (1.626 m)   Wt 246 lb (111.6 kg)   LMP 05/04/2016   SpO2 100%   BMI 42.23 kg/m   Physical Exam  Constitutional: She is oriented to person, place, and time. She appears well-developed and well-nourished. No distress.  HENT:  Head: Normocephalic.  Nose: Nose normal.  Eyes: Conjunctivae are normal.  Neck: Neck supple. No tracheal deviation present.  Cardiovascular: Normal rate, regular rhythm and normal heart sounds.   Pulmonary/Chest: Effort normal and breath sounds normal. No respiratory distress.  Abdominal: Soft. She exhibits no distension. There is no tenderness. There is no rebound and no guarding.  Neurological: She is alert and oriented to person, place, and time.  Skin: Skin is warm and dry. Capillary refill takes less than 2 seconds.  Psychiatric: She has a normal mood and affect.  Vitals reviewed.    ED Treatments / Results  Labs (all labs ordered are listed, but only abnormal results are displayed) Labs Reviewed  URINALYSIS, ROUTINE W REFLEX MICROSCOPIC - Abnormal;  Notable for the following:       Result Value   Specific Gravity, Urine 1.032 (*)    Ketones, ur 5 (*)    All other components within normal limits  URINE CULTURE  POC URINE PREG, ED    EKG  EKG Interpretation None       Radiology No results found.  Procedures Procedures (including critical care time)  Medications Ordered in ED Medications  ibuprofen (ADVIL,MOTRIN) tablet 800 mg (800 mg Oral Given 05/18/16 2206)  ondansetron (ZOFRAN-ODT) disintegrating tablet 4 mg (4 mg Oral Given 05/18/16 2206)     Initial Impression / Assessment and Plan / ED Course  I have reviewed the triage vital signs  and the nursing notes.  Pertinent labs & imaging results that were available during my care of the patient were reviewed by me and considered in my medical decision making (see chart for details).     31 year old female presents with fever, body aches, and nausea starting this evening. She is well-appearing except has a fever and feels unwell. She was provided motrin here with some relief of her symptoms. Urine is not consistent with infection, no evidence of appendicitis, cholecystitis or other abdominal cause of fever.  Advised on optimal use of motrin and tylenol for fever or symptomatic control. Plan to follow up with PCP as needed and return precautions discussed for worsening or new concerning symptoms.   Final Clinical Impressions(s) / ED Diagnoses   Final diagnoses:  Influenza-like illness    New Prescriptions Discharge Medication List as of 05/18/2016 10:56 PM    START taking these medications   Details  ibuprofen (ADVIL,MOTRIN) 600 MG tablet Take 1 tablet (600 mg total) by mouth every 6 (six) hours as needed for fever., Starting Mon 05/18/2016, Print         Leo Grosser, MD 05/18/16 2337

## 2016-05-18 NOTE — ED Triage Notes (Signed)
Pt states she has really bad body aches, headache, nausea, and fever  Onset of sxs was earlier today  Pt states she took tylenol at home around 3pm

## 2016-05-20 LAB — URINE CULTURE: Culture: <10000 — AB

## 2017-08-30 ENCOUNTER — Inpatient Hospital Stay (HOSPITAL_COMMUNITY)
Admission: AD | Admit: 2017-08-30 | Discharge: 2017-08-30 | Disposition: A | Discharge status: Home / Self Care | Payer: Self-pay | Source: Ambulatory Visit | Attending: Obstetrics and Gynecology | Admitting: Obstetrics and Gynecology

## 2017-08-30 ENCOUNTER — Inpatient Hospital Stay (HOSPITAL_COMMUNITY): Payer: Self-pay

## 2017-08-30 ENCOUNTER — Other Ambulatory Visit: Payer: Self-pay

## 2017-08-30 ENCOUNTER — Encounter (HOSPITAL_COMMUNITY): Payer: Self-pay | Admitting: *Deleted

## 2017-08-30 DIAGNOSIS — D259 Leiomyoma of uterus, unspecified: Secondary | ICD-10-CM | POA: Insufficient documentation

## 2017-08-30 DIAGNOSIS — O283 Abnormal ultrasonic finding on antenatal screening of mother: Secondary | ICD-10-CM

## 2017-08-30 DIAGNOSIS — A599 Trichomoniasis, unspecified: Secondary | ICD-10-CM

## 2017-08-30 DIAGNOSIS — O98311 Other infections with a predominantly sexual mode of transmission complicating pregnancy, first trimester: Secondary | ICD-10-CM | POA: Insufficient documentation

## 2017-08-30 DIAGNOSIS — Z3A01 Less than 8 weeks gestation of pregnancy: Secondary | ICD-10-CM | POA: Insufficient documentation

## 2017-08-30 DIAGNOSIS — A5901 Trichomonal vulvovaginitis: Secondary | ICD-10-CM | POA: Insufficient documentation

## 2017-08-30 DIAGNOSIS — R109 Unspecified abdominal pain: Secondary | ICD-10-CM | POA: Insufficient documentation

## 2017-08-30 DIAGNOSIS — O3411 Maternal care for benign tumor of corpus uteri, first trimester: Secondary | ICD-10-CM | POA: Insufficient documentation

## 2017-08-30 DIAGNOSIS — O26891 Other specified pregnancy related conditions, first trimester: Secondary | ICD-10-CM | POA: Insufficient documentation

## 2017-08-30 DIAGNOSIS — O3680X0 Pregnancy with inconclusive fetal viability, not applicable or unspecified: Secondary | ICD-10-CM

## 2017-08-30 LAB — POCT PREGNANCY, URINE: PREG TEST UR: POSITIVE — AB

## 2017-08-30 LAB — URINALYSIS, ROUTINE W REFLEX MICROSCOPIC
BILIRUBIN URINE: NEGATIVE
Glucose, UA: NEGATIVE mg/dL
Hgb urine dipstick: NEGATIVE
Ketones, ur: NEGATIVE mg/dL
Nitrite: NEGATIVE
PROTEIN: NEGATIVE mg/dL
Specific Gravity, Urine: 1.021 (ref 1.005–1.030)
pH: 5 (ref 5.0–8.0)

## 2017-08-30 LAB — WET PREP, GENITAL
SPERM: NONE SEEN
YEAST WET PREP: NONE SEEN

## 2017-08-30 LAB — CBC
HEMATOCRIT: 38.4 % (ref 36.0–46.0)
Hemoglobin: 13.1 g/dL (ref 12.0–15.0)
MCH: 29.2 pg (ref 26.0–34.0)
MCHC: 34.1 g/dL (ref 30.0–36.0)
MCV: 85.5 fL (ref 78.0–100.0)
Platelets: 223 10*3/uL (ref 150–400)
RBC: 4.49 MIL/uL (ref 3.87–5.11)
RDW: 13 % (ref 11.5–15.5)
WBC: 8 10*3/uL (ref 4.0–10.5)

## 2017-08-30 LAB — HCG, QUANTITATIVE, PREGNANCY: HCG, BETA CHAIN, QUANT, S: 896 m[IU]/mL — AB (ref <5)

## 2017-08-30 LAB — ABO/RH: ABO/RH(D): O POS

## 2017-08-30 MED ORDER — METRONIDAZOLE 500 MG PO TABS
2000.0000 mg | ORAL_TABLET | Freq: Once | ORAL | Status: AC
  Administered 2017-08-30: 2000 mg via ORAL
  Filled 2017-08-30: qty 4

## 2017-08-30 MED ORDER — ONDANSETRON 4 MG PO TBDP
4.0000 mg | ORAL_TABLET | Freq: Once | ORAL | Status: AC
  Administered 2017-08-30: 4 mg via ORAL
  Filled 2017-08-30: qty 1

## 2017-08-30 NOTE — MAU Provider Note (Addendum)
Chief Complaint: Nausea; Foot Swelling; hands tingling; and Fatigue   First Provider Initiated Contact with Patient 08/30/17 2022      SUBJECTIVE HPI: Dana Garcia is a 32 y.o. G3P1011 at [redacted]w[redacted]d by LMP who presents to maternity admissions reporting abdominal cramping, nausea, swelling in her lower legs/feet that is worse on the left, tingling hands, and fatigue x 1 week. Patient's last menstrual period was 08/01/2017.  She is using condoms inconsistently for contraception.  She has not taken a home pregnancy test but initially thought her cramping, which is intermittent lower abdominal cramping, worse in the LLQ, was because her period was starting. She reports light spotting yesterday that resolved today.  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  She denies any known hx of HTN.  She has not tried any treatments for her symptoms. There are no other associated symptoms.    History reviewed. No pertinent past medical history. Past Surgical History:  Procedure Laterality Date  . DILATION AND CURETTAGE OF UTERUS    . THYROIDECTOMY, PARTIAL     Social History   Socioeconomic History  . Marital status: Single    Spouse name: Not on file  . Number of children: Not on file  . Years of education: Not on file  . Highest education level: Not on file  Occupational History  . Not on file  Social Needs  . Financial resource strain: Not on file  . Food insecurity:    Worry: Not on file    Inability: Not on file  . Transportation needs:    Medical: Not on file    Non-medical: Not on file  Tobacco Use  . Smoking status: Never Smoker  . Smokeless tobacco: Never Used  Substance and Sexual Activity  . Alcohol use: Yes    Comment: social  . Drug use: No  . Sexual activity: Yes    Birth control/protection: Condom  Lifestyle  . Physical activity:    Days per week: Not on file    Minutes per session: Not on file  . Stress: Not  on file  Relationships  . Social connections:    Talks on phone: Not on file    Gets together: Not on file    Attends religious service: Not on file    Active member of club or organization: Not on file    Attends meetings of clubs or organizations: Not on file    Relationship status: Not on file  . Intimate partner violence:    Fear of current or ex partner: Not on file    Emotionally abused: Not on file    Physically abused: Not on file    Forced sexual activity: Not on file  Other Topics Concern  . Not on file  Social History Narrative  . Not on file   No current facility-administered medications on file prior to encounter.    Current Outpatient Medications on File Prior to Encounter  Medication Sig Dispense Refill  . acetaminophen (TYLENOL) 500 MG tablet Take 2 tablets (1,000 mg total) by mouth every 6 (six) hours as needed for moderate pain, fever or headache. 30 tablet 0  . ibuprofen (ADVIL,MOTRIN) 600 MG tablet Take 1 tablet (600 mg total) by mouth every 6 (six) hours as needed for fever. 30 tablet 0   Allergies  Allergen Reactions  . Flagyl [Metronidazole] Hives  . Septra [Sulfamethoxazole-Trimethoprim] Hives    ROS:  Review  of Systems  Constitutional: Positive for fatigue. Negative for chills and fever.  Respiratory: Negative for shortness of breath.   Cardiovascular: Positive for leg swelling. Negative for chest pain.  Gastrointestinal: Positive for abdominal pain and nausea. Negative for vomiting.  Genitourinary: Positive for pelvic pain and vaginal bleeding. Negative for difficulty urinating, dysuria, flank pain, vaginal discharge and vaginal pain.  Musculoskeletal: Positive for arthralgias.       Bilateral wrist pain/tingling  Neurological: Negative for dizziness and headaches.  Psychiatric/Behavioral: Negative.      I have reviewed patient's Past Medical Hx, Surgical Hx, Family Hx, Social Hx, medications and allergies.   Physical Exam   Patient Vitals  for the past 24 hrs:  BP Temp Temp src Pulse Resp SpO2 Weight  08/30/17 2313 136/85 - - 73 17 - -  08/30/17 2126 135/77 - - - - - -  08/30/17 1632 (!) 144/85 98.3 F (36.8 C) Oral 90 18 100 % 233 lb 12 oz (106 kg)   Constitutional: Well-developed, well-nourished female in no acute distress.  HEART: normal rate, heart sounds, regular rhythm RESP: normal effort, lung sounds clear and equal bilaterally GI: Abd soft, non-tender. Pos BS x 4 MS: Extremities nontender, +1 BLE pitting edema, +2 pitting on dorsal side of left foot only, normal ROM Neurologic: Alert and oriented x 4.  GU: Neg CVAT.  PELVIC EXAM: Cervix pink, visually closed, without lesion, small amount white frothy discharge, vaginal walls and external genitalia normal Bimanual exam: Cervix 0/long/high, firm, anterior, neg CMT, uterus nontender, nonenlarged, adnexa without tenderness, enlargement, or mass   LAB RESULTS Results for orders placed or performed during the hospital encounter of 08/30/17 (from the past 24 hour(s))  Urinalysis, Routine w reflex microscopic     Status: Abnormal   Collection Time: 08/30/17  4:40 PM  Result Value Ref Range   Color, Urine YELLOW YELLOW   APPearance CLEAR CLEAR   Specific Gravity, Urine 1.021 1.005 - 1.030   pH 5.0 5.0 - 8.0   Glucose, UA NEGATIVE NEGATIVE mg/dL   Hgb urine dipstick NEGATIVE NEGATIVE   Bilirubin Urine NEGATIVE NEGATIVE   Ketones, ur NEGATIVE NEGATIVE mg/dL   Protein, ur NEGATIVE NEGATIVE mg/dL   Nitrite NEGATIVE NEGATIVE   Leukocytes, UA TRACE (A) NEGATIVE   RBC / HPF 0-5 0 - 5 RBC/hpf   WBC, UA 0-5 0 - 5 WBC/hpf   Bacteria, UA RARE (A) NONE SEEN   Squamous Epithelial / LPF 0-5 0 - 5   Mucus PRESENT   Pregnancy, urine POC     Status: Abnormal   Collection Time: 08/30/17  4:47 PM  Result Value Ref Range   Preg Test, Ur POSITIVE (A) NEGATIVE  CBC     Status: None   Collection Time: 08/30/17  8:23 PM  Result Value Ref Range   WBC 8.0 4.0 - 10.5 K/uL   RBC  4.49 3.87 - 5.11 MIL/uL   Hemoglobin 13.1 12.0 - 15.0 g/dL   HCT 38.4 36.0 - 46.0 %   MCV 85.5 78.0 - 100.0 fL   MCH 29.2 26.0 - 34.0 pg   MCHC 34.1 30.0 - 36.0 g/dL   RDW 13.0 11.5 - 15.5 %   Platelets 223 150 - 400 K/uL  hCG, quantitative, pregnancy     Status: Abnormal   Collection Time: 08/30/17  8:23 PM  Result Value Ref Range   hCG, Beta Chain, Quant, S 896 (H) <5 mIU/mL  ABO/Rh     Status: None  Collection Time: 08/30/17  8:23 PM  Result Value Ref Range   ABO/RH(D)      O POS Performed at [facility], [address], [place], [place] [id number]   Wet prep, genital     Status: Abnormal   Collection Time: 08/30/17  8:41 PM  Result Value Ref Range   Yeast Wet Prep HPF POC NONE SEEN NONE SEEN   Trich, Wet Prep PRESENT (A) NONE SEEN   Clue Cells Wet Prep HPF POC PRESENT (A) NONE SEEN   WBC, Wet Prep HPF POC FEW (A) NONE SEEN   Sperm NONE SEEN     --/--/O POS Performed at [facility], [address] [place], [place] [address]  [phone]/24 2023)  IMAGING US Ob Comp Less 14 Wks  Result Date: 08/30/2017 CLINICAL DATA:  Umbilical pain during pregnancy, cramping. EXAM: OBSTETRIC <14 WK [place] AND TRANSVAGINAL OB US TECHNIQUE: Both transabdominal and transvaginal ultrasound examinations were performed for complete evaluation of the gestation as well as the maternal uterus, adnexal regions, and pelvic cul-de-sac. Transvaginal technique was performed to assess early pregnancy. COMPARISON:  None. FINDINGS: Intrauterine gestational sac: None Maternal uterus/adnexae: Fibroid within the LEFT uterine body measures 1.6 cm. LEFT ovary appears normal. Small hemorrhagic cyst versus corpus luteum cyst within the RIGHT ovary measures 2.1 cm. No mass or free fluid within either adjacent adnexal region. IMPRESSION: 1. No intrauterine pregnancy identified. 2. Probable small corpus luteum within the RIGHT ovary, measuring 2.1 cm. Ovaries are otherwise unremarkable. No mass or free fluid within either  adnexal region. No evidence of ectopic pregnancy demonstrated. 3. Small fibroid within the LEFT uterine body, measuring 1.6 cm. Recommend follow-up with serial beta HCG levels and follow-up pelvic ultrasound as needed to exclude occult ectopic pregnancy. Electronically Signed   By: Franki Cabot M.D.   On: 08/30/2017 21:31   US Ob Transvaginal  Result Date: 08/30/2017 CLINICAL DATA:  Umbilical pain during pregnancy, cramping. EXAM: OBSTETRIC <14 WK [place] AND TRANSVAGINAL OB US TECHNIQUE: Both transabdominal and transvaginal ultrasound examinations were performed for complete evaluation of the gestation as well as the maternal uterus, adnexal regions, and pelvic cul-de-sac. Transvaginal technique was performed to assess early pregnancy. COMPARISON:  None. FINDINGS: Intrauterine gestational sac: None Maternal uterus/adnexae: Fibroid within the LEFT uterine body measures 1.6 cm. LEFT ovary appears normal. Small hemorrhagic cyst versus corpus luteum cyst within the RIGHT ovary measures 2.1 cm. No mass or free fluid within either adjacent adnexal region. IMPRESSION: 1. No intrauterine pregnancy identified. 2. Probable small corpus luteum within the RIGHT ovary, measuring 2.1 cm. Ovaries are otherwise unremarkable. No mass or free fluid within either adnexal region. No evidence of ectopic pregnancy demonstrated. 3. Small fibroid within the LEFT uterine body, measuring 1.6 cm. Recommend follow-up with serial beta HCG levels and follow-up pelvic ultrasound as needed to exclude occult ectopic pregnancy. Electronically Signed   By: Franki Cabot M.D.   On: 08/30/2017 21:31   MAU Management/MDM: CBC, ABO/Rh, quant hcg, UA, HIV, OB US ordered Pitting edema likely related to pregnancy and/or CHTN Pt job is contributing to BLE. Recommend elevation and compression/support socks. Ice for discomfort. Discussed positive pregnancy test with pt today. Report to Julianne Handler, CNM, with [place] and lab results pending  [place] [facility]. Go on 09/02/2017.   Specialty:  Obstetrics and Gynecology Why:  8:30 am Contact information: [place]  [place] Certified Nurse-Midwife 08/30/2017  11:16 PM  [place] and labs reviewed. No IUP or adnexal mass seen on [place], findings could indicate early pregnancy, ectopic pregnancy, or failed pregnancy-discussed with pt. Will follow quant in 48 hrs. Will treat trich, notified partner needs treatment, and abstain for 3 weeks. ?allergy (hives) to metronidazole, but pt unsure because she was taking Septra at the same time in 2015. Pt requesting to try Metronidazole again. Discussed risks of reaction and need for emergency care. Pt accepts risk. Flagyl ordered.   A/P: 1. Pregnancy of unknown anatomic location   2. Abdominal pain during pregnancy in first trimester   3. Trichomonas vaginalis infection    Discharge home Follow up in [place] on 09/02/17 @0830  for labs Ectopic/return precautions  Allergies as of 08/30/2017      Reactions   Flagyl [metronidazole] Hives   Septra [sulfamethoxazole-trimethoprim] Hives      Medication List    STOP taking these medications   ibuprofen 600 MG tablet Commonly known as:  ADVIL,MOTRIN     TAKE these medications   acetaminophen 500 MG tablet Commonly known as:  TYLENOL Take 2 tablets (1,000 mg total) by mouth every 6 (six) hours as needed for moderate pain, fever or headache.      Julianne Handler, CNM  08/30/2017 11:16 PM

## 2017-08-30 NOTE — MAU Note (Signed)
Been nauseous and very tired.  Everything makes her stomach hurt (sharp cramps- worse in the morninig).  Left foot is swollen, no reason for it.  Been having numbness and tingling in hands, comes and goes.  Has been diagnosed with carpal tunnel.  Isn't usually sick, doesn't know if something is wrong.

## 2017-08-30 NOTE — Discharge Instructions (Signed)
Abdominal Pain During Pregnancy Abdominal pain is common in pregnancy. Most of the time, it does not cause harm. There are many causes of abdominal pain. Some causes are more serious than others and sometimes the cause is not known. Abdominal pain can be a sign that something is very wrong with the pregnancy or the pain may have nothing to do with the pregnancy. Always tell your health care provider if you have any abdominal pain. Follow these instructions at home:  Do not have sex or put anything in your vagina until your symptoms go away completely.  Watch your abdominal pain for any changes.  Get plenty of rest until your pain improves.  Drink enough fluid to keep your urine clear or pale yellow.  Take over-the-counter or prescription medicines only as told by your health care provider.  Keep all follow-up visits as told by your health care provider. This is important. Contact a health care provider if:  You have a fever.  Your pain gets worse or you have cramping.  Your pain continues after resting. Get help right away if:  You are bleeding, leaking fluid, or passing tissue from the vagina.  You have vomiting or diarrhea that does not go away.  You have painful or bloody urination.  You notice a decrease in your baby's movements.  You feel very weak or faint.  You have shortness of breath.  You develop a severe headache with abdominal pain.  You have abnormal vaginal discharge with abdominal pain. This information is not intended to replace advice given to you by your health care provider. Make sure you discuss any questions you have with your health care provider. Document Released: 02/23/2005 Document Revised: 12/05/2015 Document Reviewed: 09/22/2012 Elsevier Interactive Patient Education  2018 [employer].  Trichomoniasis Trichomoniasis is an STI (sexually transmitted infection) that can affect both women and men. In women, the outer area of the female genitalia  (vulva) and the vagina are affected. In men, the penis is mainly affected, but the prostate and other reproductive organs can also be involved. This condition can be treated with medicine. It often has no symptoms (is asymptomatic), especially in men. What are the causes? This condition is caused by an organism called Trichomonas vaginalis. Trichomoniasis most often spreads from person to person (is contagious) through sexual contact. What increases the risk? The following factors may make you more likely to develop this condition:  Having unprotected sexual intercourse.  Having sexual intercourse with a partner who has trichomoniasis.  Having multiple sexual partners.  Having had previous trichomoniasis infections or other STIs.  What are the signs or symptoms? In women, symptoms of trichomoniasis include:  Abnormal vaginal discharge that is clear, white, gray, or yellow-green and foamy and has an unusual "fishy" odor.  Itching and irritation of the vagina and vulva.  Burning or pain during urination or sexual intercourse.  Genital redness and swelling.  In men, symptoms of trichomoniasis include:  Penile discharge that may be foamy or contain pus.  Pain in the penis. This may happen only when urinating.  Itching or irritation inside the penis.  Burning after urination or ejaculation.  How is this diagnosed? In women, this condition may be found during a routine Pap test or physical exam. It may be found in men during a routine physical exam. Your health care provider may perform tests to help diagnose this infection, such as:  Urine tests (men and women).  The following in women: ? Testing the pH of  the vagina. ? A vaginal swab test that checks for the Trichomonas vaginalis organism. ? Testing vaginal secretions.  Your health care provider may test you for other STIs, including HIV (human immunodeficiency virus). How is this treated? This condition is treated with  medicine taken by mouth (orally), such as metronidazole or tinidazole to fight the infection. Your sexual partner(s) may also need to be tested and treated.  If you are a woman and you plan to become pregnant or think you may be pregnant, tell your health care provider right away. Some medicines that are used to treat the infection should not be taken during pregnancy.  Your health care provider may recommend over-the-counter medicines or creams to help relieve itching or irritation. You may be tested for infection again 3 months after treatment. Follow these instructions at home:  Take and use over-the-counter and prescription medicines, including creams, only as told by your health care provider.  Do not have sexual intercourse until one week after you finish your medicine, or until your health care provider approves. Ask your health care provider when you may resume sexual intercourse.  (Women) Do not douche or wear tampons while you have the infection.  Discuss your infection with your sexual partner(s). Make sure that your partner gets tested and treated, if necessary.  Keep all follow-up visits as told by your health care provider. This is important. How is this prevented?  Use condoms every time you have sex. Using condoms correctly and consistently can help protect against STIs.  Avoid having multiple sexual partners.  Talk with your sexual partner about any symptoms that either of you may have, as well as any history of STIs.  Get tested for STIs and STDs (sexually transmitted diseases) before you have sex. Ask your partner to do the same.  Do not have sexual contact if you have symptoms of trichomoniasis or another STI. Contact a health care provider if:  You still have symptoms after you finish your medicine.  You develop pain in your abdomen.  You have pain when you urinate.  You have bleeding after sexual intercourse.  You develop a rash.  You feel nauseous or you  vomit.  You plan to become pregnant or think you may be pregnant. Summary  Trichomoniasis is an STI (sexually transmitted infection) that can affect both women and men.  This condition often has no symptoms (is asymptomatic), especially in men.  You should not have sexual intercourse until one week after you finish your medicine, or until your health care provider approves. Ask your health care provider when you may resume sexual intercourse.  Discuss your infection with your sexual partner. Make sure that your partner gets tested and treated, if necessary. This information is not intended to replace advice given to you by your health care provider. Make sure you discuss any questions you have with your health care provider. Document Released: 08/19/2000 Document Revised: 01/17/2016 Document Reviewed: 01/17/2016 Elsevier Interactive Patient Education  2017 [employer].

## 2017-08-31 LAB — GC/CHLAMYDIA PROBE AMP (~~LOC~~) NOT AT ARMC
Chlamydia: NEGATIVE
Neisseria Gonorrhea: NEGATIVE

## 2017-08-31 LAB — HIV ANTIBODY (ROUTINE TESTING W REFLEX): HIV Screen 4th Generation wRfx: NONREACTIVE

## 2017-09-02 ENCOUNTER — Other Ambulatory Visit: Payer: Self-pay

## 2017-09-03 ENCOUNTER — Other Ambulatory Visit (INDEPENDENT_AMBULATORY_CARE_PROVIDER_SITE_OTHER): Payer: Self-pay

## 2017-09-03 DIAGNOSIS — O283 Abnormal ultrasonic finding on antenatal screening of mother: Secondary | ICD-10-CM

## 2017-09-03 DIAGNOSIS — O3680X0 Pregnancy with inconclusive fetal viability, not applicable or unspecified: Secondary | ICD-10-CM

## 2017-09-03 LAB — HCG, QUANTITATIVE, PREGNANCY: HCG, BETA CHAIN, QUANT, S: 3812 m[IU]/mL — AB (ref <5)

## 2017-09-03 NOTE — Progress Notes (Signed)
Patient returned for stat bhcg. She denies any bleeding and only mild cramping. Explained will have her wait for results in lobby and will review with provider and then her.  She voices understanding.

## 2017-09-03 NOTE — Addendum Note (Signed)
Addended by: Samuel Germany on: 09/03/2017 03:13 PM   Modules accepted: Orders

## 2017-09-03 NOTE — Progress Notes (Addendum)
Reviewed result with Earlie Server, NP and notified patient appropriate rise but because [place] did not show an intrauterine pregnancy we are still concerned it could be ectopic and recommend repeat US 7 days from last [place]. She is agreeable to [place] on 09/06/17 and to come to office afterwards for results. Also advised no intercourse, and strict ectopic precautions. She voices understanding.   I have reviewed all nurse's notes and labs for today.  I have reviewed the prior MAU notes, ultrasound and labs.  I agree with the documentation and the plan.  Earlie Server, RN, MSN, NP-BC Nurse Practitioner, [place] for [employer], [place] [facility] 09/03/2017 5:32 PM

## 2017-09-03 NOTE — Addendum Note (Signed)
Addended by: Samuel Germany on: 09/03/2017 05:11 PM   Modules accepted: Orders, Level of Service

## 2017-09-03 NOTE — Progress Notes (Signed)
Patient presented for labwork but left before lab drawn and before she spoke with clinical staff.  Told lab work texted her and she had to go; would come back at 4:30. Front desk reports she said she can't come back at 4:30. I asked them to tell her to come back to MAU tonight . I called MAU and notified them.

## 2017-09-06 ENCOUNTER — Encounter: Payer: Self-pay | Admitting: General Practice

## 2017-09-06 ENCOUNTER — Ambulatory Visit (INDEPENDENT_AMBULATORY_CARE_PROVIDER_SITE_OTHER): Payer: Self-pay | Admitting: General Practice

## 2017-09-06 ENCOUNTER — Ambulatory Visit (HOSPITAL_COMMUNITY)
Admission: RE | Admit: 2017-09-06 | Discharge: 2017-09-06 | Disposition: A | Discharge status: Home / Self Care | Payer: Medicaid Other | Source: Ambulatory Visit | Attending: Nurse Practitioner | Admitting: Nurse Practitioner

## 2017-09-06 DIAGNOSIS — O283 Abnormal ultrasonic finding on antenatal screening of mother: Secondary | ICD-10-CM | POA: Diagnosis present

## 2017-09-06 DIAGNOSIS — O3680X0 Pregnancy with inconclusive fetal viability, not applicable or unspecified: Secondary | ICD-10-CM

## 2017-09-06 NOTE — Progress Notes (Signed)
Patient presents to office today for viability ultrasound results. Reviewed results with Marcille Buffy who finds visualized yolk sac, but too early to see embryo - patient should have follow up ultrasound in 2 weeks. Scheduled for 7/16.  Informed patient of results and follow up ultrasound appt. Patient verbalized understanding and had no questions at this time.

## 2017-09-06 NOTE — Progress Notes (Signed)
I have reviewed the chart and agree with nursing staff's documentation of this patient's encounter.  Marcille Buffy, CNM 09/06/2017 3:32 PM

## 2017-09-21 ENCOUNTER — Ambulatory Visit (INDEPENDENT_AMBULATORY_CARE_PROVIDER_SITE_OTHER): Payer: Self-pay

## 2017-09-21 ENCOUNTER — Ambulatory Visit (HOSPITAL_COMMUNITY)
Admission: RE | Admit: 2017-09-21 | Discharge: 2017-09-21 | Disposition: A | Discharge status: Home / Self Care | Payer: Medicaid Other | Source: Ambulatory Visit | Attending: Advanced Practice Midwife | Admitting: Advanced Practice Midwife

## 2017-09-21 ENCOUNTER — Encounter: Payer: Self-pay | Admitting: General Practice

## 2017-09-21 DIAGNOSIS — O3680X0 Pregnancy with inconclusive fetal viability, not applicable or unspecified: Secondary | ICD-10-CM | POA: Diagnosis present

## 2017-09-21 DIAGNOSIS — Z3A01 Less than 8 weeks gestation of pregnancy: Secondary | ICD-10-CM | POA: Insufficient documentation

## 2017-09-21 DIAGNOSIS — Z712 Person consulting for explanation of examination or test findings: Secondary | ICD-10-CM

## 2017-09-21 NOTE — Progress Notes (Signed)
I have reviewed the chart and agree with nursing staff's documentation of this patient's encounter.  Marcille Buffy, CNM 09/21/2017 5:54 PM

## 2017-09-21 NOTE — Progress Notes (Signed)
Pt here today for [place] results.  Notified pt that US showed a good pregnancy and to start prenatal care.  [place] pics given to pt.  Verified pt's medications.  List of medications safe to take in pregnancy given to pt.  Proof of pregnancy provided by the front office.

## 2017-10-19 ENCOUNTER — Other Ambulatory Visit: Payer: Self-pay

## 2017-10-19 ENCOUNTER — Encounter: Payer: Self-pay | Admitting: General Practice

## 2017-10-19 ENCOUNTER — Ambulatory Visit: Payer: Self-pay | Admitting: *Deleted

## 2017-10-19 VITALS — BP 114/75 | HR 99 | Wt 243.0 lb

## 2017-10-19 DIAGNOSIS — Z3480 Encounter for supervision of other normal pregnancy, unspecified trimester: Secondary | ICD-10-CM | POA: Insufficient documentation

## 2017-10-19 LAB — POCT URINALYSIS DIPSTICK OB
Bilirubin, UA: NEGATIVE
Blood, UA: NEGATIVE
Glucose, UA: NEGATIVE — AB
Ketones, UA: NEGATIVE
Leukocytes, UA: NEGATIVE
Nitrite, UA: NEGATIVE
POC,PROTEIN,UA: NEGATIVE
Spec Grav, UA: 1.02 (ref 1.010–1.025)
Urobilinogen, UA: 0.2 E.U./dL
pH, UA: 6.5 (ref 5.0–8.0)

## 2017-10-19 NOTE — Progress Notes (Signed)
   PRENATAL INTAKE SUMMARY  Ms. Ledesma presents today New OB Nurse Interview.  OB History    Gravida  3   Para  1   Term  1   Preterm      AB  1   Living  1     SAB      TAB  1   Ectopic      Multiple      Live Births  1          I have reviewed the patient's medical, obstetrical, social, and family histories, medications, and available lab results.  SUBJECTIVE She has no unusual complaints  OBJECTIVE Initial Prenatal Interview (New OB)  GENERAL APPEARANCE: alert, well appearing   ASSESSMENT Normal pregnancy  PLAN Prenatal care OB urine dip OB Urine Culture HgbEval SMA CF A1C Glucose   Derl Barrow, RN

## 2017-10-21 LAB — CULTURE, OB URINE

## 2017-10-21 LAB — URINE CULTURE, OB REFLEX

## 2017-10-27 LAB — HEMOGLOBINOPATHY EVALUATION
Ferritin: 72 ng/mL (ref 15–150)
HEMATOCRIT: 38.1 % (ref 34.0–46.6)
HGB A2 QUANT: 2 % (ref 1.8–3.2)
HGB A: 98 % (ref 96.4–98.8)
HGB S: 0 %
HGB VARIANT: 0 %
Hemoglobin: 12.8 g/dL (ref 11.1–15.9)
Hgb C: 0 %
Hgb F Quant: 0 % (ref 0.0–2.0)
Hgb Solubility: NEGATIVE
MCH: 29 pg (ref 26.6–33.0)
MCHC: 33.6 g/dL (ref 31.5–35.7)
MCV: 86 fL (ref 79–97)
Platelets: 204 10*3/uL (ref 150–450)
RBC: 4.41 x10E6/uL (ref 3.77–5.28)
RDW: 14 % (ref 12.3–15.4)
WBC: 5.9 10*3/uL (ref 3.4–10.8)

## 2017-10-27 LAB — CYSTIC FIBROSIS MUTATION 97: Interpretation: NOT DETECTED

## 2017-10-27 LAB — SMN1 COPY NUMBER ANALYSIS (SMA CARRIER SCREENING)

## 2017-10-27 LAB — HEMOGLOBIN A1C
Est. average glucose Bld gHb Est-mCnc: 108 mg/dL
HEMOGLOBIN A1C: 5.4 % (ref 4.8–5.6)

## 2017-10-29 ENCOUNTER — Other Ambulatory Visit (HOSPITAL_COMMUNITY)
Admission: RE | Admit: 2017-10-29 | Discharge: 2017-10-29 | Disposition: A | Discharge status: Home / Self Care | Payer: Medicaid Other | Source: Ambulatory Visit | Attending: Family | Admitting: Family

## 2017-10-29 ENCOUNTER — Encounter: Payer: Self-pay | Admitting: Family

## 2017-10-29 ENCOUNTER — Other Ambulatory Visit: Payer: Self-pay

## 2017-10-29 ENCOUNTER — Ambulatory Visit (INDEPENDENT_AMBULATORY_CARE_PROVIDER_SITE_OTHER): Payer: Self-pay | Admitting: Family

## 2017-10-29 VITALS — BP 122/78 | HR 83 | Wt 242.0 lb

## 2017-10-29 DIAGNOSIS — Z3481 Encounter for supervision of other normal pregnancy, first trimester: Secondary | ICD-10-CM

## 2017-10-29 DIAGNOSIS — Z3A13 13 weeks gestation of pregnancy: Secondary | ICD-10-CM | POA: Insufficient documentation

## 2017-10-29 DIAGNOSIS — Z9009 Acquired absence of other part of head and neck: Secondary | ICD-10-CM

## 2017-10-29 DIAGNOSIS — Z9889 Other specified postprocedural states: Secondary | ICD-10-CM

## 2017-10-29 DIAGNOSIS — Z3482 Encounter for supervision of other normal pregnancy, second trimester: Secondary | ICD-10-CM | POA: Diagnosis present

## 2017-10-29 DIAGNOSIS — E890 Postprocedural hypothyroidism: Secondary | ICD-10-CM | POA: Insufficient documentation

## 2017-10-29 DIAGNOSIS — Z3480 Encounter for supervision of other normal pregnancy, unspecified trimester: Secondary | ICD-10-CM

## 2017-10-29 NOTE — Assessment & Plan Note (Addendum)
Assess TSH, Free T3 & Free T4 each trimester > normal 2nd trimester

## 2017-10-29 NOTE — Patient Instructions (Signed)

## 2017-10-29 NOTE — Progress Notes (Signed)
  Subjective:    Dana Garcia is a [id number] [redacted]w[redacted]d being seen today for her first obstetrical visit.  Pt reports desired pregnancy one year prior with no results.  This pregnancy unexpected.  FOB excited, it would be his first child. Patient does intend to breast feed. Breastfed for approximately 6-8 weeks.  Pregnancy history fully reviewed.  Patient reports fatigue.  Vitals:   10/29/17 0839  BP: 122/78  Pulse: 83  Weight: 242 lb (109.8 kg)    HISTORY: OB History  Gravida Para Term Preterm AB Living  3 1 1   1 1   SAB TAB Ectopic Multiple Live Births    1     1    # Outcome Date GA Lbr Len/2nd Weight Sex Delivery Anes PTL Lv  3 Current           2 Term 05/27/06 [redacted]w[redacted]d  6 lb 7 oz (2.92 kg) M Vag-Spont EPI N LIV  1 TAB            History reviewed. No pertinent past medical history. Past Surgical History:  Procedure Laterality Date  . DILATION AND CURETTAGE OF UTERUS    . THYROIDECTOMY, PARTIAL     Family History  Problem Relation Age of Onset  . Hypertension Other   . Diabetes Other   . Hypertension Mother   . Diabetes Maternal Grandmother   . Diabetes Maternal Grandfather   . Kidney disease Paternal Grandmother      Exam    BP 122/78   Pulse 83   Wt 242 lb (109.8 kg)   LMP 07/29/2017   BMI 41.54 kg/m  Uterine Size: size equals dates  Pelvic Exam:    Perineum: No Hemorrhoids, Normal Perineum   Vulva: normal   Vagina:  normal mucosa, normal discharge, no palpable nodules   pH: Not done   Cervix: Slight spotting following Pap, no cervical motion tenderness and no lesions   Adnexa: normal adnexa and no mass, fullness, tenderness   Bony Pelvis: Adequate  System: Breast:  No nipple retraction or dimpling, No nipple discharge or bleeding, No axillary or supraclavicular adenopathy, Normal to palpation without dominant masses   Skin: normal coloration and turgor, no rashes    Neurologic: negative   Extremities: normal strength, tone, and muscle mass   HEENT  neck supple with midline trachea and thyroid without masses   Mouth/Teeth mucous membranes moist, pharynx normal without lesions   Neck supple and no masses   Cardiovascular: regular rate and rhythm, no murmurs or gallops   Respiratory:  appears well, vitals normal, no respiratory distress, acyanotic, normal RR, neck free of mass or lymphadenopathy, chest clear, no wheezing, crepitations, rhonchi, normal symmetric air entry   Abdomen: soft, non-tender; bowel sounds normal; no masses,  no organomegaly   Urinary: urethral meatus normal     Assessment:    Pregnancy: [id number] Patient Active Problem List   Diagnosis Date Noted  . 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 Supervision of other normal pregnancy, antepartum 10/19/2017        Plan:     Initial labs drawn.  TSH, T3 & T4 added due to history of thyroidectomy. Prenatal vitamins. Problem list reviewed and updated. Enrolled in 49 and MyChart Genetic Screening discussed First Screen and Integrated Screen: requested Panorama - defer to next visit due to Medicaid pending. First trimester ultrasound discussed  Follow up in 4 weeks.    Venia Carbon Sharyon Medicus 10/29/2017

## 2017-10-30 LAB — T3, FREE: T3 FREE: 3.2 pg/mL (ref 2.0–4.4)

## 2017-10-30 LAB — RUBELLA SCREEN: Rubella Antibodies, IGG: 3.44 index (ref >0.99)

## 2017-10-30 LAB — ANTIBODY SCREEN: ANTIBODY SCREEN: NEGATIVE

## 2017-10-30 LAB — T4, FREE: FREE T4: 1.01 ng/dL (ref 0.82–1.77)

## 2017-10-30 LAB — RPR: RPR: NONREACTIVE

## 2017-10-30 LAB — HEPATITIS B SURFACE ANTIGEN: Hepatitis B Surface Ag: NEGATIVE

## 2017-10-30 LAB — TSH: TSH: 2.24 u[IU]/mL (ref 0.450–4.500)

## 2017-11-01 ENCOUNTER — Encounter: Payer: Self-pay | Admitting: General Practice

## 2017-11-01 LAB — CYTOLOGY - PAP
Chlamydia: NEGATIVE
DIAGNOSIS: NEGATIVE
HPV: NOT DETECTED
NEISSERIA GONORRHEA: NEGATIVE

## 2017-11-02 ENCOUNTER — Other Ambulatory Visit: Payer: Self-pay | Admitting: *Deleted

## 2017-11-02 DIAGNOSIS — O26892 Other specified pregnancy related conditions, second trimester: Principal | ICD-10-CM

## 2017-11-02 DIAGNOSIS — N898 Other specified noninflammatory disorders of vagina: Secondary | ICD-10-CM

## 2017-11-02 MED ORDER — TERCONAZOLE 0.4 % VA CREA
1.0000 | TOPICAL_CREAM | Freq: Every day | VAGINAL | 0 refills | Status: DC
Start: 2017-11-02 — End: 2018-04-29

## 2017-11-09 ENCOUNTER — Encounter: Payer: Self-pay | Admitting: General Practice

## 2017-11-19 ENCOUNTER — Inpatient Hospital Stay (HOSPITAL_COMMUNITY)
Admission: AD | Admit: 2017-11-19 | Discharge: 2017-11-19 | Disposition: A | Discharge status: Home / Self Care | Payer: Medicaid Other | Source: Ambulatory Visit | Attending: Obstetrics & Gynecology | Admitting: Obstetrics & Gynecology

## 2017-11-19 ENCOUNTER — Other Ambulatory Visit: Payer: Self-pay

## 2017-11-19 ENCOUNTER — Encounter (HOSPITAL_COMMUNITY): Payer: Self-pay

## 2017-11-19 DIAGNOSIS — Z841 Family history of disorders of kidney and ureter: Secondary | ICD-10-CM | POA: Insufficient documentation

## 2017-11-19 DIAGNOSIS — Z882 Allergy status to sulfonamides status: Secondary | ICD-10-CM | POA: Diagnosis not present

## 2017-11-19 DIAGNOSIS — M7989 Other specified soft tissue disorders: Secondary | ICD-10-CM | POA: Diagnosis not present

## 2017-11-19 DIAGNOSIS — O1202 Gestational edema, second trimester: Secondary | ICD-10-CM | POA: Diagnosis present

## 2017-11-19 DIAGNOSIS — O26899 Other specified pregnancy related conditions, unspecified trimester: Secondary | ICD-10-CM | POA: Diagnosis not present

## 2017-11-19 DIAGNOSIS — Z8249 Family history of ischemic heart disease and other diseases of the circulatory system: Secondary | ICD-10-CM | POA: Diagnosis not present

## 2017-11-19 DIAGNOSIS — Z3A16 16 weeks gestation of pregnancy: Secondary | ICD-10-CM | POA: Insufficient documentation

## 2017-11-19 DIAGNOSIS — O26892 Other specified pregnancy related conditions, second trimester: Secondary | ICD-10-CM | POA: Insufficient documentation

## 2017-11-19 DIAGNOSIS — Z9889 Other specified postprocedural states: Secondary | ICD-10-CM | POA: Insufficient documentation

## 2017-11-19 DIAGNOSIS — R109 Unspecified abdominal pain: Secondary | ICD-10-CM

## 2017-11-19 DIAGNOSIS — Z881 Allergy status to other antibiotic agents status: Secondary | ICD-10-CM | POA: Insufficient documentation

## 2017-11-19 DIAGNOSIS — R1030 Lower abdominal pain, unspecified: Secondary | ICD-10-CM | POA: Diagnosis not present

## 2017-11-19 DIAGNOSIS — E890 Postprocedural hypothyroidism: Secondary | ICD-10-CM | POA: Insufficient documentation

## 2017-11-19 DIAGNOSIS — Z833 Family history of diabetes mellitus: Secondary | ICD-10-CM | POA: Diagnosis not present

## 2017-11-19 LAB — URINALYSIS, ROUTINE W REFLEX MICROSCOPIC
Bilirubin Urine: NEGATIVE
Glucose, UA: NEGATIVE mg/dL
Hgb urine dipstick: NEGATIVE
Ketones, ur: 20 mg/dL — AB
LEUKOCYTES UA: NEGATIVE
NITRITE: NEGATIVE
Protein, ur: NEGATIVE mg/dL
SPECIFIC GRAVITY, URINE: 1.024 (ref 1.005–1.030)
pH: 7 (ref 5.0–8.0)

## 2017-11-19 NOTE — MAU Note (Signed)
Body is aching, feet and hands are swollen. Pain in lower abd.

## 2017-11-19 NOTE — Discharge Instructions (Signed)
I want you to increase your rest for the remainder of the day. Increase your water intake for the remainder of the day (at least 10-12 bottles/day). Take Tylenol 1000 mg by mouth every 6 hrs as needed for pain.

## 2017-11-19 NOTE — MAU Provider Note (Signed)
History     CSN: [id number]  Arrival date and time: 11/19/17 1042   First Provider Initiated Contact with Patient 11/19/17 1405      Chief Complaint  Patient presents with  . Abdominal Pain  . Edema    Hands & Feet   HPI  Ms.  Dana Garcia is a 32 y.o. year old G75P1011 female at [redacted]w[redacted]d weeks gestation who presents to MAU reporting body aches, lower abdominal pain, hands and feet swollen since last night around 2300. 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 Both jobs require her to stand on her feet constantly. She reports that she "usually drinks lots of water throughout the day, but maybe not as much yesterday." When she got home from work she tried taking a warm shower for the body aches and then elevating feet for pain; without relief. She does report that the swelling in her right leg "went down", but not the LT leg. She did not go to work today.  History reviewed. No pertinent past medical history.  Past Surgical History:  Procedure Laterality Date  . DILATION AND CURETTAGE OF UTERUS    . THYROIDECTOMY, PARTIAL      Family History  Problem Relation Age of Onset  . Hypertension Other   . Diabetes Other   . Hypertension Mother   . Diabetes Maternal Grandmother   . Diabetes Maternal Grandfather   . Kidney disease Paternal Grandmother     Social History   Tobacco Use  . Smoking status: Never Smoker  . Smokeless tobacco: Never Used  Substance Use Topics  . Alcohol use: Not Currently    Comment: social  . Drug use: No    Allergies:  Allergies  Allergen Reactions  . Flagyl [Metronidazole] Hives  . Septra [Sulfamethoxazole-Trimethoprim] Hives    Medications Prior to Admission  Medication Sig Dispense Refill Last Dose  . acetaminophen (TYLENOL) 500 MG tablet Take 2 tablets (1,000 mg total) by mouth every 6 (six) hours as needed for moderate pain, fever or headache. 30 tablet 0 Taking  . Prenatal  Vit-Fe Fumarate-FA (PREPLUS) 27-1 MG TABS Take 1 tablet by mouth daily.   Taking  . terconazole (TERAZOL 7) 0.4 % vaginal cream Place 1 applicator vaginally at bedtime. 45 g 0     Review of Systems  Constitutional: Negative.   HENT: Negative.   Eyes: Negative.   Respiratory: Negative.   Cardiovascular: Positive for leg swelling.  Gastrointestinal: Positive for abdominal pain (lower).  Endocrine: Negative.   Genitourinary: Negative.   Musculoskeletal: Negative.   Skin: Negative.   Allergic/Immunologic: Negative.   Neurological: Negative.   Hematological: Negative.   Psychiatric/Behavioral: Negative.    Physical Exam   Blood pressure 126/71, pulse 78, temperature 98.1 F (36.7 C), temperature source Oral, resp. rate 17, weight 110.8 kg, last menstrual period 07/29/2017, SpO2 100 %.  Physical Exam  Nursing note and vitals reviewed. Constitutional: She is oriented to person, place, and time. She appears well-developed and well-nourished.  HENT:  Head: Normocephalic.  Eyes: Pupils are equal, round, and reactive to light.  Neck: Normal range of motion.  Cardiovascular: Normal rate, regular rhythm and normal heart sounds.  Respiratory: Effort normal.  GI: Soft.  Genitourinary:  Genitourinary Comments: Pelvic deferred  Musculoskeletal: Normal range of motion. She exhibits edema (non-pitting).  Neurological: She is alert and oriented to person, place, and time. She has normal reflexes.  Skin: Skin is warm  and dry.  Psychiatric: She has a normal mood and affect. Her behavior is normal. Judgment and thought content normal.    MAU Course  Procedures  MDM CCUA FHTs by doppler: 144 bpm Bedside U/S: very active fetus c/w with 16+ wks gestation -- patient reassured.  Results for orders placed or performed during the hospital encounter of 11/19/17 (from the past 24 hour(s))  Urinalysis, Routine w reflex microscopic     Status: Abnormal   Collection Time: 11/19/17 12:44 PM  Result  Value Ref Range   Color, Urine YELLOW YELLOW   APPearance HAZY (A) CLEAR   Specific Gravity, Urine 1.024 1.005 - 1.030   pH 7.0 5.0 - 8.0   Glucose, UA NEGATIVE NEGATIVE mg/dL   Hgb urine dipstick NEGATIVE NEGATIVE   Bilirubin Urine NEGATIVE NEGATIVE   Ketones, ur 20 (A) NEGATIVE mg/dL   Protein, ur NEGATIVE NEGATIVE mg/dL   Nitrite NEGATIVE NEGATIVE   Leukocytes, UA NEGATIVE NEGATIVE    Assessment and Plan  Abdominal pain affecting pregnancy - Plan: Discharge patient - Advised to take Tylenol 1000 mg every 6 hrs prn pain - Information provided on abdominal pain    Edema during pregnancy in second trimester  - Information provided on peripheral edema  - Advised to increase water intake and rest for the remainder of the day - Note to be OOW today given by RN  - Discharge patient - Keep scheduled appt with [facility] on 11/25/17 - Patient verbalized an understanding of the plan of care and agrees.   Laury Deep, MSN, CNM 11/19/2017, 2:06 PM

## 2017-11-25 ENCOUNTER — Encounter: Payer: Self-pay | Admitting: Obstetrics and Gynecology

## 2017-12-09 ENCOUNTER — Encounter: Payer: Self-pay | Admitting: Obstetrics and Gynecology

## 2017-12-10 ENCOUNTER — Encounter: Payer: Self-pay | Admitting: General Practice

## 2018-04-07 LAB — OB RESULTS CONSOLE GBS: GBS: POSITIVE

## 2018-04-29 ENCOUNTER — Encounter (HOSPITAL_COMMUNITY): Payer: Self-pay | Admitting: *Deleted

## 2018-04-29 ENCOUNTER — Inpatient Hospital Stay (HOSPITAL_BASED_OUTPATIENT_CLINIC_OR_DEPARTMENT_OTHER): Payer: Medicaid Other

## 2018-04-29 ENCOUNTER — Inpatient Hospital Stay (HOSPITAL_COMMUNITY)
Admission: AD | Admit: 2018-04-29 | Discharge: 2018-04-29 | Disposition: A | Discharge status: Home / Self Care | Payer: Medicaid Other | Attending: Obstetrics and Gynecology | Admitting: Obstetrics and Gynecology

## 2018-04-29 DIAGNOSIS — Z3480 Encounter for supervision of other normal pregnancy, unspecified trimester: Secondary | ICD-10-CM

## 2018-04-29 DIAGNOSIS — Z3A39 39 weeks gestation of pregnancy: Secondary | ICD-10-CM | POA: Insufficient documentation

## 2018-04-29 DIAGNOSIS — R109 Unspecified abdominal pain: Secondary | ICD-10-CM

## 2018-04-29 DIAGNOSIS — O1202 Gestational edema, second trimester: Secondary | ICD-10-CM

## 2018-04-29 DIAGNOSIS — O368130 Decreased fetal movements, third trimester, not applicable or unspecified: Secondary | ICD-10-CM

## 2018-04-29 DIAGNOSIS — O26899 Other specified pregnancy related conditions, unspecified trimester: Secondary | ICD-10-CM

## 2018-04-29 DIAGNOSIS — O368190 Decreased fetal movements, unspecified trimester, not applicable or unspecified: Secondary | ICD-10-CM

## 2018-04-29 NOTE — MAU Provider Note (Signed)
History    CSN: [id number] Arrival date and time: 04/29/18 1551 First Provider Initiated Contact with Patient 04/29/18 1630     Chief Complaint  Patient presents with  . Non-stress Test   HPI Dana Garcia is a 33yo G3P1011 at [redacted]w[redacted]d who presents to MAU from clinic for prolonged fetal monitoring. Has felt decreased fetal movement for last two days. States usually notices lots of movement during certain periods of the day or after having something cold or sweet to drink but hasn't noticed quite as much. Cervix was checked today in clinic and reports unchanged, about 1cm. Denies contractions, leakage of fluids, discharge, vaginal bleeding.   OB History    Gravida  3   Para  1   Term  1   Preterm      AB  1   Living  1     SAB      TAB  1   Ectopic      Multiple      Live Births  1           History reviewed. No pertinent past medical history.  Past Surgical History:  Procedure Laterality Date  . DILATION AND CURETTAGE OF UTERUS    . THYROIDECTOMY, PARTIAL      Family History  Problem Relation Age of Onset  . Hypertension Other   . Diabetes Other   . Hypertension Mother   . Diabetes Maternal Grandmother   . Diabetes Maternal Grandfather   . Kidney disease Paternal Grandmother     Social History   Tobacco Use  . Smoking status: Never Smoker  . Smokeless tobacco: Never Used  Substance Use Topics  . Alcohol use: Not Currently    Comment: social  . Drug use: Not Currently    Types: Marijuana    Comment: last in 2005    Allergies:  Allergies  Allergen Reactions  . Flagyl [Metronidazole] Hives  . Septra [Sulfamethoxazole-Trimethoprim] Hives    Medications Prior to Admission  Medication Sig Dispense Refill Last Dose  . acetaminophen (TYLENOL) 500 MG tablet Take 2 tablets (1,000 mg total) by mouth every 6 (six) hours as needed for moderate pain, fever or headache. 30 tablet 0 Taking  . Prenatal Vit-Fe Fumarate-FA (PREPLUS) 27-1 MG TABS Take 1 tablet  by mouth daily.   Taking  . terconazole (TERAZOL 7) 0.4 % vaginal cream Place 1 applicator vaginally at bedtime. 45 g 0     Review of Systems  Constitutional: Positive for fatigue.  Gastrointestinal: Negative for abdominal pain, nausea and vomiting.  Genitourinary: Negative for vaginal bleeding and vaginal discharge.   Physical Exam   Blood pressure 130/77, pulse 99, temperature 98 F (36.7 C), temperature source Oral, resp. rate 17, height 5\' 4"  (1.626 m), weight 115.4 kg, last menstrual period 07/29/2017, SpO2 100 %.  Physical Exam  Nursing note and vitals reviewed. Constitutional: She is oriented to person, place, and time. She appears well-developed and well-nourished. No distress.  HENT:  Head: Normocephalic and atraumatic.  Eyes: Conjunctivae and EOM are normal. No scleral icterus.  Cardiovascular: Normal rate.  Respiratory: No respiratory distress.  GI:  Gravid, non-tender  Genitourinary:    Genitourinary Comments: deferred   Musculoskeletal:        General: No edema.  Neurological: She is alert and oriented to person, place, and time.  Skin: She is not diaphoretic.  Psychiatric: She has a normal mood and affect. Her behavior is normal.   MAU Course  Procedures  MDM --  sent over from clinic for prolonged monitoring - about 2 hours per Dr. Carlis Abbott  -- BPP 8/8  -- NST reactive 140s/mod/+a/-d - monitored for about 90 minutes so overall fetal surveillance 10/10  Assessment and Plan  33yo G3P1011 at [redacted]w[redacted]d who presents with decreased fetal movement from clinic. Reassuring fetal surveillance with reactive NST and BPP 8/8. Counseled on kick counts, labor precautions. Encouraged patient to follow-up in clinic next week. Patient voiced understanding of plan.    Glenice Bow, DO 04/29/2018, 6:38 PM

## 2018-04-29 NOTE — MAU Note (Signed)
Patient had an appt in the office today and failed her NST.  Also reports not feeling the baby move as much as she normally does.  Denies pain, vaginal bleeding or discharge.  Sent for prolonged monitoring.

## 2018-05-06 ENCOUNTER — Encounter (HOSPITAL_COMMUNITY): Payer: Self-pay | Admitting: *Deleted

## 2018-05-06 ENCOUNTER — Encounter (HOSPITAL_COMMUNITY)
Admission: AD | Disposition: A | Discharge status: Home / Self Care | Payer: Self-pay | Source: Home / Self Care | Attending: Obstetrics and Gynecology

## 2018-05-06 ENCOUNTER — Inpatient Hospital Stay (HOSPITAL_COMMUNITY)
Admission: AD | Admit: 2018-05-06 | Discharge: 2018-05-09 | DRG: 788 | Disposition: A | Discharge status: Home / Self Care | Payer: Medicaid Other | Attending: Obstetrics and Gynecology | Admitting: Obstetrics and Gynecology

## 2018-05-06 ENCOUNTER — Inpatient Hospital Stay (HOSPITAL_COMMUNITY): Payer: Medicaid Other | Admitting: Anesthesiology

## 2018-05-06 DIAGNOSIS — Z3A40 40 weeks gestation of pregnancy: Secondary | ICD-10-CM | POA: Diagnosis not present

## 2018-05-06 DIAGNOSIS — O99824 Streptococcus B carrier state complicating childbirth: Secondary | ICD-10-CM | POA: Diagnosis present

## 2018-05-06 DIAGNOSIS — Z3480 Encounter for supervision of other normal pregnancy, unspecified trimester: Secondary | ICD-10-CM

## 2018-05-06 DIAGNOSIS — O99214 Obesity complicating childbirth: Secondary | ICD-10-CM | POA: Diagnosis present

## 2018-05-06 DIAGNOSIS — O4292 Full-term premature rupture of membranes, unspecified as to length of time between rupture and onset of labor: Principal | ICD-10-CM | POA: Diagnosis present

## 2018-05-06 DIAGNOSIS — O26893 Other specified pregnancy related conditions, third trimester: Secondary | ICD-10-CM | POA: Diagnosis present

## 2018-05-06 DIAGNOSIS — O26899 Other specified pregnancy related conditions, unspecified trimester: Secondary | ICD-10-CM

## 2018-05-06 DIAGNOSIS — O1202 Gestational edema, second trimester: Secondary | ICD-10-CM

## 2018-05-06 DIAGNOSIS — R109 Unspecified abdominal pain: Secondary | ICD-10-CM

## 2018-05-06 HISTORY — DX: Other specified health status: Z78.9

## 2018-05-06 LAB — CBC
HCT: 38.7 % (ref 36.0–46.0)
Hemoglobin: 12.5 g/dL (ref 12.0–15.0)
MCH: 28.2 pg (ref 26.0–34.0)
MCHC: 32.3 g/dL (ref 30.0–36.0)
MCV: 87.4 fL (ref 80.0–100.0)
Platelets: 184 10*3/uL (ref 150–400)
RBC: 4.43 MIL/uL (ref 3.87–5.11)
RDW: 13.5 % (ref 11.5–15.5)
WBC: 8.8 10*3/uL (ref 4.0–10.5)
nRBC: 0 % (ref 0.0–0.2)

## 2018-05-06 LAB — TYPE AND SCREEN
ABO/RH(D): O POS
ANTIBODY SCREEN: NEGATIVE

## 2018-05-06 LAB — ABO/RH: ABO/RH(D): O POS

## 2018-05-06 SURGERY — Surgical Case
Anesthesia: Epidural

## 2018-05-06 MED ORDER — ONDANSETRON HCL 4 MG/2ML IJ SOLN: 4.0000 mg | Freq: Four times a day (QID) | INTRAMUSCULAR | Status: DC | PRN

## 2018-05-06 MED ORDER — OXYTOCIN 10 UNIT/ML IJ SOLN
INTRAVENOUS | Status: DC | PRN
  Administered 2018-05-06: 40 [IU] via INTRAVENOUS

## 2018-05-06 MED ORDER — OXYCODONE-ACETAMINOPHEN 5-325 MG PO TABS: 1.0000 | ORAL_TABLET | ORAL | Status: DC | PRN

## 2018-05-06 MED ORDER — OXYTOCIN 40 UNITS IN NORMAL SALINE INFUSION - SIMPLE MED
INTRAVENOUS | Status: AC
  Filled 2018-05-06: qty 1000

## 2018-05-06 MED ORDER — EPHEDRINE 5 MG/ML INJ: 10.0000 mg | INTRAVENOUS | Status: DC | PRN

## 2018-05-06 MED ORDER — OXYTOCIN 40 UNITS IN NORMAL SALINE INFUSION - SIMPLE MED: 2.5000 [IU]/h | INTRAVENOUS | Status: DC

## 2018-05-06 MED ORDER — SODIUM CHLORIDE 0.9 % IR SOLN
Status: DC | PRN
  Administered 2018-05-06: 1000 mL

## 2018-05-06 MED ORDER — OXYTOCIN BOLUS FROM INFUSION: 500.0000 mL | Freq: Once | INTRAVENOUS | Status: DC

## 2018-05-06 MED ORDER — LACTATED RINGERS IV SOLN
INTRAVENOUS | Status: DC | PRN
  Administered 2018-05-06: 22:00:00 via INTRAVENOUS

## 2018-05-06 MED ORDER — SODIUM CHLORIDE 0.9 % IV SOLN
5.0000 10*6.[IU] | Freq: Once | INTRAVENOUS | Status: AC
  Administered 2018-05-06: 5 10*6.[IU] via INTRAVENOUS
  Filled 2018-05-06: qty 5

## 2018-05-06 MED ORDER — ONDANSETRON HCL 4 MG/2ML IJ SOLN
INTRAMUSCULAR | Status: DC | PRN
  Administered 2018-05-06: 4 mg via INTRAVENOUS

## 2018-05-06 MED ORDER — PHENYLEPHRINE 40 MCG/ML (10ML) SYRINGE FOR IV PUSH (FOR BLOOD PRESSURE SUPPORT)
80.0000 ug | PREFILLED_SYRINGE | INTRAVENOUS | Status: DC | PRN
  Filled 2018-05-06: qty 10

## 2018-05-06 MED ORDER — CEFAZOLIN SODIUM-DEXTROSE 2-4 GM/100ML-% IV SOLN
INTRAVENOUS | Status: AC
  Filled 2018-05-06: qty 100

## 2018-05-06 MED ORDER — SODIUM BICARBONATE 8.4 % IV SOLN
INTRAVENOUS | Status: AC
  Filled 2018-05-06: qty 50

## 2018-05-06 MED ORDER — PHENYLEPHRINE 40 MCG/ML (10ML) SYRINGE FOR IV PUSH (FOR BLOOD PRESSURE SUPPORT)
PREFILLED_SYRINGE | INTRAVENOUS | Status: AC
  Filled 2018-05-06: qty 10

## 2018-05-06 MED ORDER — OXYTOCIN 40 UNITS IN NORMAL SALINE INFUSION - SIMPLE MED
1.0000 m[IU]/min | INTRAVENOUS | Status: DC
  Administered 2018-05-06: 2 m[IU]/min via INTRAVENOUS
  Filled 2018-05-06: qty 1000

## 2018-05-06 MED ORDER — LIDOCAINE HCL (PF) 1 % IJ SOLN: 30.0000 mL | INTRAMUSCULAR | Status: DC | PRN

## 2018-05-06 MED ORDER — DEXAMETHASONE SODIUM PHOSPHATE 4 MG/ML IJ SOLN
INTRAMUSCULAR | Status: DC | PRN
  Administered 2018-05-06: 4 mg via INTRAVENOUS

## 2018-05-06 MED ORDER — PENICILLIN G 3 MILLION UNITS IVPB - SIMPLE MED
3.0000 10*6.[IU] | INTRAVENOUS | Status: DC
  Administered 2018-05-06 (×2): 3 10*6.[IU] via INTRAVENOUS
  Filled 2018-05-06 (×2): qty 100

## 2018-05-06 MED ORDER — LACTATED RINGERS IV SOLN: 500.0000 mL | Freq: Once | INTRAVENOUS | Status: DC

## 2018-05-06 MED ORDER — OXYTOCIN 10 UNIT/ML IJ SOLN
INTRAMUSCULAR | Status: AC
  Filled 2018-05-06: qty 4

## 2018-05-06 MED ORDER — LIDOCAINE-EPINEPHRINE (PF) 2 %-1:200000 IJ SOLN
INTRAMUSCULAR | Status: AC
  Filled 2018-05-06: qty 30

## 2018-05-06 MED ORDER — TERBUTALINE SULFATE 1 MG/ML IJ SOLN: 0.2500 mg | Freq: Once | INTRAMUSCULAR | Status: DC | PRN

## 2018-05-06 MED ORDER — MEPERIDINE HCL 25 MG/ML IJ SOLN
INTRAMUSCULAR | Status: DC | PRN
  Administered 2018-05-06 (×2): 12.5 mg via INTRAVENOUS

## 2018-05-06 MED ORDER — LACTATED RINGERS AMNIOINFUSION
INTRAVENOUS | Status: DC
  Administered 2018-05-06: 19:00:00 via INTRAUTERINE

## 2018-05-06 MED ORDER — MORPHINE SULFATE (PF) 0.5 MG/ML IJ SOLN
INTRAMUSCULAR | Status: DC | PRN
  Administered 2018-05-06: 2 mg via INTRAVENOUS
  Administered 2018-05-06: 3 mg via EPIDURAL

## 2018-05-06 MED ORDER — DIPHENHYDRAMINE HCL 50 MG/ML IJ SOLN: 12.5000 mg | INTRAMUSCULAR | Status: DC | PRN

## 2018-05-06 MED ORDER — LACTATED RINGERS IV SOLN
INTRAVENOUS | Status: DC
  Administered 2018-05-06 (×3): via INTRAVENOUS

## 2018-05-06 MED ORDER — MORPHINE SULFATE (PF) 0.5 MG/ML IJ SOLN
INTRAMUSCULAR | Status: AC
  Filled 2018-05-06: qty 10

## 2018-05-06 MED ORDER — LIDOCAINE HCL (PF) 1 % IJ SOLN
INTRAMUSCULAR | Status: DC | PRN
  Administered 2018-05-06: 5 mL via EPIDURAL

## 2018-05-06 MED ORDER — SOD CITRATE-CITRIC ACID 500-334 MG/5ML PO SOLN
30.0000 mL | ORAL | Status: DC | PRN
  Administered 2018-05-06: 30 mL via ORAL
  Filled 2018-05-06: qty 15

## 2018-05-06 MED ORDER — FENTANYL-BUPIVACAINE-NACL 0.5-0.125-0.9 MG/250ML-% EP SOLN
12.0000 mL/h | EPIDURAL | Status: DC | PRN
  Filled 2018-05-06: qty 250

## 2018-05-06 MED ORDER — DEXAMETHASONE SODIUM PHOSPHATE 4 MG/ML IJ SOLN
INTRAMUSCULAR | Status: AC
  Filled 2018-05-06: qty 1

## 2018-05-06 MED ORDER — SODIUM BICARBONATE 8.4 % IV SOLN
INTRAVENOUS | Status: DC | PRN
  Administered 2018-05-06 (×3): 5 mL via EPIDURAL

## 2018-05-06 MED ORDER — SODIUM CHLORIDE (PF) 0.9 % IJ SOLN
INTRAMUSCULAR | Status: DC | PRN
  Administered 2018-05-06: 12 mL/h via EPIDURAL

## 2018-05-06 MED ORDER — CEFAZOLIN SODIUM-DEXTROSE 2-3 GM-%(50ML) IV SOLR
INTRAVENOUS | Status: DC | PRN
  Administered 2018-05-06: 2 g via INTRAVENOUS

## 2018-05-06 MED ORDER — PHENYLEPHRINE 40 MCG/ML (10ML) SYRINGE FOR IV PUSH (FOR BLOOD PRESSURE SUPPORT): 80.0000 ug | PREFILLED_SYRINGE | INTRAVENOUS | Status: DC | PRN

## 2018-05-06 MED ORDER — MEPERIDINE HCL 25 MG/ML IJ SOLN
INTRAMUSCULAR | Status: AC
  Filled 2018-05-06: qty 1

## 2018-05-06 MED ORDER — PROPOFOL 10 MG/ML IV BOLUS
INTRAVENOUS | Status: AC
  Filled 2018-05-06: qty 20

## 2018-05-06 MED ORDER — ACETAMINOPHEN 325 MG PO TABS
650.0000 mg | ORAL_TABLET | ORAL | Status: DC | PRN
  Administered 2018-05-07: 650 mg via ORAL

## 2018-05-06 MED ORDER — OXYCODONE-ACETAMINOPHEN 5-325 MG PO TABS: 2.0000 | ORAL_TABLET | ORAL | Status: DC | PRN

## 2018-05-06 MED ORDER — LACTATED RINGERS IV SOLN: 500.0000 mL | INTRAVENOUS | Status: DC | PRN

## 2018-05-06 MED ORDER — ONDANSETRON HCL 4 MG/2ML IJ SOLN
INTRAMUSCULAR | Status: AC
  Filled 2018-05-06: qty 4

## 2018-05-06 SURGICAL SUPPLY — 40 items
APL SKNCLS STERI-STRIP NONHPOA (GAUZE/BANDAGES/DRESSINGS) ×1
BENZOIN TINCTURE PRP APPL 2/3 (GAUZE/BANDAGES/DRESSINGS) ×2 IMPLANT
CHLORAPREP W/TINT 26ML (MISCELLANEOUS) ×3 IMPLANT
CLAMP CORD UMBIL (MISCELLANEOUS) IMPLANT
CLOSURE STERI-STRIP 1/2X4 (GAUZE/BANDAGES/DRESSINGS) ×1
CLOTH BEACON ORANGE TIMEOUT ST (SAFETY) ×3 IMPLANT
CLSR STERI-STRIP ANTIMIC 1/2X4 (GAUZE/BANDAGES/DRESSINGS) ×1 IMPLANT
DRSG OPSITE POSTOP 4X10 (GAUZE/BANDAGES/DRESSINGS) ×3 IMPLANT
ELECT REM PT RETURN 9FT ADLT (ELECTROSURGICAL) ×3
ELECTRODE REM PT RTRN 9FT ADLT (ELECTROSURGICAL) ×1 IMPLANT
EXTRACTOR VACUUM M CUP 4 TUBE (SUCTIONS) IMPLANT
EXTRACTOR VACUUM M CUP 4' TUBE (SUCTIONS)
GLOVE BIOGEL PI IND STRL 6.5 (GLOVE) ×1 IMPLANT
GLOVE BIOGEL PI IND STRL 7.0 (GLOVE) ×1 IMPLANT
GLOVE BIOGEL PI INDICATOR 6.5 (GLOVE) ×2
GLOVE BIOGEL PI INDICATOR 7.0 (GLOVE) ×2
GLOVE ECLIPSE 6.5 STRL STRAW (GLOVE) ×3 IMPLANT
GOWN STRL REUS W/TWL LRG LVL3 (GOWN DISPOSABLE) ×6 IMPLANT
HOVERMATT SINGLE USE (MISCELLANEOUS) ×2 IMPLANT
KIT ABG SYR 3ML LUER SLIP (SYRINGE) IMPLANT
NDL HYPO 25X5/8 SAFETYGLIDE (NEEDLE) IMPLANT
NEEDLE HYPO 25X5/8 SAFETYGLIDE (NEEDLE) IMPLANT
NS IRRIG 1000ML POUR BTL (IV SOLUTION) ×3 IMPLANT
PACK C SECTION WH (CUSTOM PROCEDURE TRAY) ×3 IMPLANT
PAD ABD 7.5X8 STRL (GAUZE/BANDAGES/DRESSINGS) IMPLANT
PAD OB MATERNITY 4.3X12.25 (PERSONAL CARE ITEMS) ×3 IMPLANT
PENCIL SMOKE EVAC W/HOLSTER (ELECTROSURGICAL) ×3 IMPLANT
RTRCTR C-SECT PINK 25CM LRG (MISCELLANEOUS) ×3 IMPLANT
SPONGE GAUZE 4X4 12PLY STER LF (GAUZE/BANDAGES/DRESSINGS) ×4 IMPLANT
SUT MON AB 2-0 CT1 27 (SUTURE) ×3 IMPLANT
SUT MON AB 4-0 PS1 27 (SUTURE) IMPLANT
SUT PDS AB 0 CTX 60 (SUTURE) IMPLANT
SUT PLAIN 2 0 XLH (SUTURE) IMPLANT
SUT VIC AB 0 CTX 36 (SUTURE) ×12
SUT VIC AB 0 CTX36XBRD ANBCTRL (SUTURE) ×4 IMPLANT
SUT VIC AB 4-0 KS 27 (SUTURE) IMPLANT
TAPE CLOTH SURG 4X10 WHT LF (GAUZE/BANDAGES/DRESSINGS) ×2 IMPLANT
TOWEL OR 17X24 6PK STRL BLUE (TOWEL DISPOSABLE) ×3 IMPLANT
TRAY FOLEY W/BAG SLVR 14FR LF (SET/KITS/TRAYS/PACK) ×3 IMPLANT
WATER STERILE IRR 1000ML POUR (IV SOLUTION) ×3 IMPLANT

## 2018-05-06 NOTE — Anesthesia Preprocedure Evaluation (Signed)
Anesthesia Evaluation  Patient identified by MRN, date of birth, ID band Patient awake    Reviewed: Allergy & Precautions, NPO status , Patient's Chart, lab work & pertinent test results  Airway Mallampati: II  TM Distance: >3 FB Neck ROM: Full    Dental no notable dental hx. (+) Teeth Intact   Pulmonary neg pulmonary ROS,    Pulmonary exam normal breath sounds clear to auscultation       Cardiovascular Exercise Tolerance: Good negative cardio ROS Normal cardiovascular exam Rhythm:Regular Rate:Normal     Neuro/Psych negative neurological ROS  negative psych ROS   GI/Hepatic negative GI ROS, Neg liver ROS,   Endo/Other  negative endocrine ROS  Renal/GU negative Renal ROS     Musculoskeletal   Abdominal (+) + obese,   Peds  Hematology Hgb 12.5 Plts 184   Anesthesia Other Findings   Reproductive/Obstetrics (+) Pregnancy                             Anesthesia Physical Anesthesia Plan  ASA: III  Anesthesia Plan: Epidural   Post-op Pain Management:    Induction:   PONV Risk Score and Plan:   Airway Management Planned:   Additional Equipment:   Intra-op Plan:   Post-operative Plan:   Informed Consent: I have reviewed the patients History and Physical, chart, labs and discussed the procedure including the risks, benefits and alternatives for the proposed anesthesia with the patient or authorized representative who has indicated his/her understanding and acceptance.       Plan Discussed with:   Anesthesia Plan Comments: (40F  Term w Increased BMI for LEA)       Anesthesia Quick Evaluation

## 2018-05-06 NOTE — H&P (Addendum)
33 y.o. [redacted]w[redacted]d  G3P1011 comes in from office.  In office she reported noticing leaking fluid starting last night that has persisted today.  She came into office for routine schedule appt and was found to have +pool, +nitrazine, +fern.  Otherwise has good fetal movement and no bleeding.  She was advised to proceed to [facility] for direct admission.  Past Medical History:  Diagnosis Date  . 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 DILATION AND CURETTAGE OF UTERUS    . THYROIDECTOMY, PARTIAL      OB History  Gravida Para Term Preterm AB Living  3 1 1   1 1   SAB TAB Ectopic Multiple Live Births    1     1    # Outcome Date GA Lbr Len/2nd Weight Sex Delivery Anes PTL Lv  3 Current           2 Term 05/27/06 [redacted]w[redacted]d  2920 g M Vag-Spont EPI N LIV  1 TAB 2006            Social History   Socioeconomic History  . Marital status: Single    Spouse name: Not on file  . Number of children: Not on file  . Years of education: Not on file  . Highest education level: 12th grade  Occupational History  . Not on file  Social Needs  . Financial resource strain: Not very hard  . Food insecurity:    Worry: Never true    Inability: Never true  . Transportation needs:    Medical: No    Non-medical: No  Tobacco Use  . Smoking status: Never Smoker  . Smokeless tobacco: Never Used  Substance and Sexual Activity  . Alcohol use: Not Currently    Comment: social  . Drug use: Not Currently    Types: Marijuana    Comment: last in 2005  . Sexual activity: Not Currently    Birth control/protection: None  Lifestyle  . Physical activity:    Days per week: 0 days    Minutes per session: 0 min  . Stress: Not at all  Relationships  . Social connections:    Talks on phone: More than three times a week    Gets together: More  than three times a week    Attends religious service: Never    Active member of club or organization: No    Attends meetings of clubs or organizations: Never    Relationship status: Never married  . Intimate partner violence:    Fear of current or ex partner: No    Emotionally abused: No    Physically abused: No    Forced sexual activity: Not on file  Other Topics Concern  . Not on file  Social History Narrative  . Not on file   Flagyl [metronidazole] and Septra [sulfamethoxazole-trimethoprim]    Prenatal Transfer Tool  Maternal Diabetes: No Genetic Screening: Normal Maternal Ultrasounds/Referrals: Normal Fetal Ultrasounds or other Referrals:  None Maternal Substance Abuse:  No Significant Maternal Medications:  ASA 81mg  daily Significant Maternal Lab Results: Lab values include: Group B Strep positive  Other PNC: morbid obesity, on ASA 81 mgfor risk factors    Vitals:   05/06/18 1403 05/06/18 1435 05/06/18 1502 05/06/18 1528  BP: 126/77 126/72 124/77 137/90  Pulse: 87 90 93 96  Resp:  Temp:  98 F (36.7 C)    TempSrc:  Oral    Weight:      Height:        Lungs/Cor:  NAD Abdomen:  soft, gravid Ex:  no cords, erythema SVE:  1.5/50/-3 at admission FHTs: 145, good STV, NST R; Cat 1 tracing prior with pt in restroom and poor tracing recently, back on monitor with mod variability and recent variable decels.  Discussed with RN who is just getting her back in bed. Toco:  q 2-4 currently   A/P   Admitted for PROM  Per pt has not been taking ASA regularly  GBS Pos- IV PCN  Pitocin 2x2  Confirmed vtx in MAu by bedside US  RN to call concerns for tracing, currently Cat 2  Allyn Kenner

## 2018-05-06 NOTE — Brief Op Note (Signed)
05/06/2018  11:59 PM  PATIENT:  Dana Garcia  33 y.o. female  PRE-OPERATIVE DIAGNOSIS: Non-reassuring fetal heart tones, remote from delivery, suspected OP presentation, prolonged ROM POST-OPERATIVE DIAGNOSIS:  same  PROCEDURE:  Procedure(s): CESAREAN SECTION (N/A)- low transverse  SURGEON:  Surgeon(s) and Role:    Rogue Bussing, Gisele Pack, DO - Primary  ANESTHESIA:   epidural  EBL:  805 mL   FINDINGS: female infance, OP presentation, partial nuchal cord, APGARs pending, but baby vigorous and crying, wt pending.  SPECIMEN:  Source of Specimen:  placenta  DISPOSITION OF SPECIMEN:  PATHOLOGY  COUNTS:  YES  PLAN OF CARE: Admit to inpatient   PATIENT DISPOSITION:  PACU - hemodynamically stable.   Delay start of Pharmacological VTE agent (>24hrs) due to surgical blood loss or risk of bleeding: not applicable

## 2018-05-06 NOTE — Progress Notes (Signed)
Recurrent variable decels noted on tracing, proceed to L&D and IUPC placed for amnioinfusion.  Variable decels resolved and FHT had moderate variability and some accels, but after approx 1 hour recurrent late decels developed, resusitative measures did not resolve and pitocin was discontinued.  Pt started having chills and temp was 99.3.  Signifcant caput was found with cervix 4-5cm dilated.  Discussed factors with patient and my advice to proceed with c/s.  Pt agreeds, risks, benefits, complications reviewed and consent obtained.

## 2018-05-06 NOTE — Anesthesia Procedure Notes (Signed)
Epidural Patient location during procedure: OB Start time: 05/06/2018 4:52 PM End time: 05/06/2018 5:07 PM  Staffing Anesthesiologist: Barnet Glasgow, MD Performed: anesthesiologist   Preanesthetic Checklist Completed: patient identified, site marked, surgical consent, pre-op evaluation, timeout performed, IV checked, risks and benefits discussed and monitors and equipment checked  Epidural Patient position: sitting Prep: site prepped and draped and DuraPrep Patient monitoring: continuous pulse ox and blood pressure Approach: midline Location: L3-L4 Injection technique: LOR air  Needle:  Needle type: Tuohy  Needle gauge: 17 G Needle length: 9 cm and 9 Needle insertion depth: 8 cm Catheter type: closed end flexible Catheter size: 19 Gauge Catheter at skin depth: 13 cm Test dose: negative  Assessment Events: blood not aspirated, injection not painful, no injection resistance, negative IV test and no paresthesia  Additional Notes Patient identified. Risks/Benefits/Options discussed with patient including but not limited to bleeding, infection, nerve damage, paralysis, failed block, incomplete pain control, headache, blood pressure changes, nausea, vomiting, reactions to medication both or allergic, itching and postpartum back pain. Confirmed with bedside nurse the patient's most recent platelet count. Confirmed with patient that they are not currently taking any anticoagulation, have any bleeding history or any family history of bleeding disorders. Patient expressed understanding and wished to proceed. All questions were answered. Sterile technique was used throughout the entire procedure. Please see nursing notes for vital signs. Test dose was given through epidural needle and negative prior to continuing to dose epidural or start infusion. Warning signs of high block given to the patient including shortness of breath, tingling/numbness in hands, complete motor block, or any  concerning symptoms with instructions to call for help. Patient was given instructions on fall risk and not to get out of bed. All questions and concerns addressed with instructions to call with any issues. 1 Attempt (S) . Patient tolerated procedure well.

## 2018-05-06 NOTE — Transfer of Care (Signed)
Immediate Anesthesia Transfer of Care Note  Patient: Dana Garcia  Procedure(s) Performed: CESAREAN SECTION (N/A )  Patient Location: PACU  Anesthesia Type:Epidural  Level of Consciousness: awake, alert  and oriented  Airway & Oxygen Therapy: Patient Spontanous Breathing  Post-op Assessment: Report given to RN and Post -op Vital signs reviewed and stable  Post vital signs: Reviewed and stable HR 116, RR 20, SaO2 100%, BP 140/73  Last Vitals:  Vitals Value Taken Time  BP    Temp    Pulse    Resp    SpO2      Last Pain:  Vitals:   05/06/18 2138  TempSrc: Oral  PainSc:          Complications: No apparent anesthesia complications

## 2018-05-07 ENCOUNTER — Encounter (HOSPITAL_COMMUNITY): Payer: Self-pay

## 2018-05-07 LAB — CBC
HCT: 32.4 % — ABNORMAL LOW (ref 36.0–46.0)
Hemoglobin: 10.6 g/dL — ABNORMAL LOW (ref 12.0–15.0)
MCH: 28.8 pg (ref 26.0–34.0)
MCHC: 32.7 g/dL (ref 30.0–36.0)
MCV: 88 fL (ref 80.0–100.0)
NRBC: 0 % (ref 0.0–0.2)
Platelets: 150 10*3/uL (ref 150–400)
RBC: 3.68 MIL/uL — ABNORMAL LOW (ref 3.87–5.11)
RDW: 13.5 % (ref 11.5–15.5)
WBC: 16.6 10*3/uL — AB (ref 4.0–10.5)

## 2018-05-07 LAB — RPR: RPR Ser Ql: NONREACTIVE

## 2018-05-07 MED ORDER — NALBUPHINE HCL 10 MG/ML IJ SOLN
5.0000 mg | Freq: Once | INTRAMUSCULAR | Status: AC | PRN
  Administered 2018-05-07: 5 mg via INTRAVENOUS

## 2018-05-07 MED ORDER — IBUPROFEN 800 MG PO TABS
800.0000 mg | ORAL_TABLET | Freq: Three times a day (TID) | ORAL | Status: DC
  Administered 2018-05-07 – 2018-05-09 (×8): 800 mg via ORAL
  Filled 2018-05-07 (×8): qty 1

## 2018-05-07 MED ORDER — SIMETHICONE 80 MG PO CHEW
80.0000 mg | CHEWABLE_TABLET | ORAL | Status: DC
  Administered 2018-05-07 – 2018-05-08 (×2): 80 mg via ORAL
  Filled 2018-05-07 (×2): qty 1

## 2018-05-07 MED ORDER — OXYCODONE HCL 5 MG/5ML PO SOLN: 5.0000 mg | Freq: Once | ORAL | Status: DC | PRN

## 2018-05-07 MED ORDER — NALBUPHINE HCL 10 MG/ML IJ SOLN
5.0000 mg | INTRAMUSCULAR | Status: DC | PRN
  Administered 2018-05-07 (×2): 5 mg via INTRAVENOUS
  Filled 2018-05-07 (×3): qty 1

## 2018-05-07 MED ORDER — PROMETHAZINE HCL 25 MG/ML IJ SOLN: 6.2500 mg | INTRAMUSCULAR | Status: DC | PRN

## 2018-05-07 MED ORDER — SODIUM CHLORIDE 0.9% FLUSH: 3.0000 mL | INTRAVENOUS | Status: DC | PRN

## 2018-05-07 MED ORDER — MENTHOL 3 MG MT LOZG: 1.0000 | LOZENGE | OROMUCOSAL | Status: DC | PRN

## 2018-05-07 MED ORDER — SCOPOLAMINE 1 MG/3DAYS TD PT72
MEDICATED_PATCH | TRANSDERMAL | Status: AC
  Filled 2018-05-07: qty 1

## 2018-05-07 MED ORDER — DIPHENHYDRAMINE HCL 50 MG/ML IJ SOLN
12.5000 mg | INTRAMUSCULAR | Status: DC | PRN
  Administered 2018-05-07: 12.5 mg via INTRAVENOUS
  Filled 2018-05-07: qty 1

## 2018-05-07 MED ORDER — PRENATAL MULTIVITAMIN CH
1.0000 | ORAL_TABLET | Freq: Every day | ORAL | Status: DC
  Administered 2018-05-07 – 2018-05-09 (×3): 1 via ORAL
  Filled 2018-05-07 (×3): qty 1

## 2018-05-07 MED ORDER — ONDANSETRON HCL 4 MG/2ML IJ SOLN: 4.0000 mg | Freq: Three times a day (TID) | INTRAMUSCULAR | Status: DC | PRN

## 2018-05-07 MED ORDER — DIPHENHYDRAMINE HCL 25 MG PO CAPS: 25.0000 mg | ORAL_CAPSULE | ORAL | Status: DC | PRN

## 2018-05-07 MED ORDER — NALBUPHINE HCL 10 MG/ML IJ SOLN: 5.0000 mg | Freq: Once | INTRAMUSCULAR | Status: AC | PRN

## 2018-05-07 MED ORDER — SENNOSIDES-DOCUSATE SODIUM 8.6-50 MG PO TABS
2.0000 | ORAL_TABLET | ORAL | Status: DC
  Administered 2018-05-07 – 2018-05-08 (×2): 2 via ORAL
  Filled 2018-05-07 (×2): qty 2

## 2018-05-07 MED ORDER — OXYCODONE HCL 5 MG PO TABS: 5.0000 mg | ORAL_TABLET | Freq: Once | ORAL | Status: DC | PRN

## 2018-05-07 MED ORDER — COCONUT OIL OIL: 1.0000 "application " | TOPICAL_OIL | Status: DC | PRN

## 2018-05-07 MED ORDER — TETANUS-DIPHTH-ACELL PERTUSSIS 5-2.5-18.5 LF-MCG/0.5 IM SUSP: 0.5000 mL | Freq: Once | INTRAMUSCULAR | Status: DC

## 2018-05-07 MED ORDER — NALOXONE HCL 0.4 MG/ML IJ SOLN: 0.4000 mg | INTRAMUSCULAR | Status: DC | PRN

## 2018-05-07 MED ORDER — OXYCODONE-ACETAMINOPHEN 5-325 MG PO TABS
1.0000 | ORAL_TABLET | ORAL | Status: DC | PRN
  Administered 2018-05-08: 1 via ORAL
  Filled 2018-05-07: qty 1

## 2018-05-07 MED ORDER — ACETAMINOPHEN 325 MG PO TABS
ORAL_TABLET | ORAL | Status: AC
  Filled 2018-05-07: qty 2

## 2018-05-07 MED ORDER — ACETAMINOPHEN 325 MG PO TABS
650.0000 mg | ORAL_TABLET | ORAL | Status: DC | PRN
  Administered 2018-05-08 – 2018-05-09 (×2): 650 mg via ORAL
  Filled 2018-05-07 (×2): qty 2

## 2018-05-07 MED ORDER — ACETAMINOPHEN 500 MG PO TABS
ORAL_TABLET | ORAL | Status: AC
  Filled 2018-05-07: qty 1

## 2018-05-07 MED ORDER — SIMETHICONE 80 MG PO CHEW
80.0000 mg | CHEWABLE_TABLET | ORAL | Status: DC | PRN
  Administered 2018-05-07 (×2): 80 mg via ORAL

## 2018-05-07 MED ORDER — WITCH HAZEL-GLYCERIN EX PADS: 1.0000 "application " | MEDICATED_PAD | CUTANEOUS | Status: DC | PRN

## 2018-05-07 MED ORDER — KETOROLAC TROMETHAMINE 30 MG/ML IJ SOLN
30.0000 mg | Freq: Once | INTRAMUSCULAR | Status: AC | PRN
  Administered 2018-05-07: 30 mg via INTRAVENOUS

## 2018-05-07 MED ORDER — KETOROLAC TROMETHAMINE 30 MG/ML IJ SOLN
INTRAMUSCULAR | Status: AC
  Filled 2018-05-07: qty 1

## 2018-05-07 MED ORDER — HYDROMORPHONE HCL 1 MG/ML IJ SOLN: 0.2500 mg | INTRAMUSCULAR | Status: DC | PRN

## 2018-05-07 MED ORDER — SCOPOLAMINE 1 MG/3DAYS TD PT72
1.0000 | MEDICATED_PATCH | Freq: Once | TRANSDERMAL | Status: DC
  Administered 2018-05-07: 1.5 mg via TRANSDERMAL

## 2018-05-07 MED ORDER — LACTATED RINGERS IV SOLN
INTRAVENOUS | Status: DC
  Administered 2018-05-07: 09:00:00 via INTRAVENOUS

## 2018-05-07 MED ORDER — SIMETHICONE 80 MG PO CHEW
80.0000 mg | CHEWABLE_TABLET | Freq: Three times a day (TID) | ORAL | Status: DC
  Administered 2018-05-07 – 2018-05-09 (×6): 80 mg via ORAL
  Filled 2018-05-07 (×7): qty 1

## 2018-05-07 MED ORDER — NALOXONE HCL 4 MG/10ML IJ SOLN
1.0000 ug/kg/h | INTRAVENOUS | Status: DC | PRN
  Filled 2018-05-07: qty 5

## 2018-05-07 MED ORDER — ZOLPIDEM TARTRATE 5 MG PO TABS: 5.0000 mg | ORAL_TABLET | Freq: Every evening | ORAL | Status: DC | PRN

## 2018-05-07 MED ORDER — OXYTOCIN 40 UNITS IN NORMAL SALINE INFUSION - SIMPLE MED: 2.5000 [IU]/h | INTRAVENOUS | Status: AC

## 2018-05-07 MED ORDER — DIBUCAINE 1 % RE OINT: 1.0000 "application " | TOPICAL_OINTMENT | RECTAL | Status: DC | PRN

## 2018-05-07 MED ORDER — NALBUPHINE HCL 10 MG/ML IJ SOLN: 5.0000 mg | INTRAMUSCULAR | Status: DC | PRN

## 2018-05-07 MED ORDER — DIPHENHYDRAMINE HCL 25 MG PO CAPS: 25.0000 mg | ORAL_CAPSULE | Freq: Four times a day (QID) | ORAL | Status: DC | PRN

## 2018-05-07 NOTE — Op Note (Signed)
NAME: Dana Garcia, ODWYER MEDICAL RECORD [id number] ACCOUNT [id number] DATE OF BIRTH:08/13/1985 FACILITY: [facility] LOCATION: [place], [place]  OPERATIVE REPORT  DATE OF PROCEDURE:  05/06/2018  PREOPERATIVE DIAGNOSES:  Nonreassuring fetal heart tones, remote from delivery, suspected occiput posterior presentation, prolonged rupture of membranes.  POSTOPERATIVE DIAGNOSES:  Nonreassuring fetal heart tones, remote from delivery, suspected occiput posterior presentation, prolonged rupture of membranes.  PROCEDURE:  Low transverse cesarean section.  SURGEON:  Allyn Kenner, DO  ANESTHESIA:  Epidural.  ESTIMATED BLOOD LOSS:  805 mL.  FINDINGS:  Female infant in OP presentation with partial nuchal cord.  Apgars 8 and 8 and weight 8 pounds 3 ounces.  Normal tubes and ovaries bilaterally.  SPECIMENS:  Placenta, cord blood.  COMPLICATIONS:  None.  CONDITION:  Stable to PACU.  DESCRIPTION OF PROCEDURE:  The patient was taken to the operating room where epidural anesthesia was found to be adequate.  She was prepped and draped in the normal sterile fashion in dorsal supine position with a leftward tilt.  A scalpel was used to  make a Pfannenstiel skin incision, which was carried down to underlying layer of fascia with Bovie cautery.  The fascia was incised in the midline with the scalpel and extended laterally with Mayo scissors.  Kocher clamps were placed at the superior  aspect of the fascial incision, and rectus muscles were dissected off bluntly and sharply.  Kocher clamps were then placed at the inferior aspect of the fascial incision.  The rectus muscles were dissected off bluntly and sharply.  Hemostat was used to  separate rectus muscle superiorly, and with good visualization of the peritoneum, it was entered bluntly.  The peritoneal incision was extended by lateral traction.  The abdomen and pelvis was surveyed and no abnormalities noted.  An Actuary was then placed, and the vesicouterine peritoneum identified, tented, and entered sharply with Metzenbaum scissors.  The bladder flap was extended by Mayo scissors laterally and developed digitally.  A low transverse cesarean incision was  made with the scalpel, and amniotic sac was entered sharply.  Clear fluid was noted.  The infant's head was located, elevated, and delivered without difficulty, followed by the remainder of the infant's body.  The infant was bulb suctioned and dried  while delayed cord clamping was performed at 1 minute.  The cord was clamped and cut, and the infant was handed off to awaiting neonatology.  Cord blood was collected, and external massage of the uterus was performed with gentle traction on the umbilical  cord to deliver the placenta.  The uterus was cleared of all clot and debris, and the uterine incision was closed with 0 Vicryl in a running locked fashion, followed by second layer of vertical imbrication.  Additional bleeding was noted on the serosa  at midline, and a partial third running locked layer was performed to ensure excellent hemostasis.  Ovaries and tubes were examined and found to be normal.  Alexis self-retractor was removed.  Bladder blade was inserted.  Again, the incision was examined  and hemostasis was confirmed.  Peritoneum was reapproximated and closed with Monocryl in a running fashion with 2 continuous sutures reapproximating the lower part of the rectus muscle.  The fascia was then reapproximated and closed with 0 Vicryl in a  running fashion.  Subcutaneous tissue was irrigated, dried and minimal use of Bovie cautery was needed for hemostasis.  Skin was reapproximated and closed with Vicryl on a Keith needle.  Pressure dressing was then  placed.  The patient tolerated the  procedure well.  Sponge, lap and needle counts were correct x2.  The patient was taken to recovery in stable condition.  LN/NUANCE  D:05/07/2018 T:05/07/2018  JOB:[id number]

## 2018-05-07 NOTE — Anesthesia Postprocedure Evaluation (Signed)
Anesthesia Post Note  Patient: Dana Garcia  Procedure(s) Performed: CESAREAN SECTION (N/A )     Patient location during evaluation: Mother Baby Anesthesia Type: Epidural Level of consciousness: awake and alert Pain management: pain level controlled Vital Signs Assessment: post-procedure vital signs reviewed and stable Respiratory status: spontaneous breathing, nonlabored ventilation and respiratory function stable Cardiovascular status: stable Postop Assessment: no headache, no backache and epidural receding Anesthetic complications: no    Last Vitals:  Vitals:   05/07/18 0208 05/07/18 0323  BP: 108/65 116/71  Pulse: 81 86  Resp: 18 18  Temp: 37.3 C 37.4 C  SpO2: 98% 98%    Last Pain:  Vitals:   05/07/18 0324  TempSrc:   PainSc: 0-No pain   Pain Goal:                   Lynda Rainwater

## 2018-05-07 NOTE — Anesthesia Postprocedure Evaluation (Signed)
Anesthesia Post Note  Patient: Dana Garcia  Procedure(s) Performed: CESAREAN SECTION (N/A )     Patient location during evaluation: Mother Baby Anesthesia Type: Epidural Level of consciousness: awake and alert Pain management: pain level controlled Vital Signs Assessment: post-procedure vital signs reviewed and stable Respiratory status: spontaneous breathing, nonlabored ventilation and respiratory function stable Cardiovascular status: stable Postop Assessment: no headache, no backache, epidural receding, no apparent nausea or vomiting, patient able to bend at knees, able to ambulate and adequate PO intake Anesthetic complications: no    Last Vitals:  Vitals:   05/07/18 0433 05/07/18 0530  BP: 116/68   Pulse: 83   Resp: 18 18  Temp: 37 C 37.6 C  SpO2: 97% 98%    Last Pain:  Vitals:   05/07/18 0709  TempSrc:   PainSc: 0-No pain   Pain Goal:                   [employer]

## 2018-05-07 NOTE — Progress Notes (Signed)
Pt out of recovery (Recovery finished)- transporting to M/B with baby via bed.

## 2018-05-07 NOTE — Addendum Note (Signed)
Addendum  created 05/07/18 0746 by Hewitt Blade, CRNA   Clinical Note Signed

## 2018-05-07 NOTE — Progress Notes (Signed)
Patient is eating, ambulating,foley in place.  Pain control is good.  Appropriate lochia, no complaints.  Vitals:   05/07/18 0323 05/07/18 0433 05/07/18 0530 05/07/18 0830  BP: 116/71 116/68  (!) 95/53  Pulse: 86 83  74  Resp: 18 18 18 18   Temp: 99.3 F (37.4 C) 98.6 F (37 C) 99.6 F (37.6 C) 98.5 F (36.9 C)  TempSrc: Oral Oral Oral Oral  SpO2: 98% 97% 98% 99%  Weight:      Height:       Abd: soft, nontender Inc: c/d/i Fundus firm No calf tenderness  Lab Results  Component Value Date   WBC 16.6 (H) 05/07/2018   HGB 10.6 (L) 05/07/2018   HCT 32.4 (L) 05/07/2018   MCV 88.0 05/07/2018   PLT 150 05/07/2018    --/--/O POS, O POS Performed at [facility] [address] [place], [place] [address]  (02/28 1135)  A/P Post op day #1 s/p c/s IV and foley to be removed per protocol. Pt is doing well  Routine care.   Allyn Kenner

## 2018-05-07 NOTE — Lactation Note (Signed)
This note was copied from a baby's chart. Lactation Consultation Note  Patient Name: Dana Garcia [id number] Date: 05/07/2018 Reason for consult: Initial assessment;Term Baby is 83 hours old.  Mom's choice on admission is breast/formula.  Baby has been going to the breast frequently.  Mom states she is latching easily.  She gives formula after if baby still acting hungry.  Instructed to always offer breast first with any feeding cue and limit formula to small amounts.  Lactation services and support information given and reviewed.  Encouraged to call if concerns or for assist with feeding.  Maternal Data Does the patient have breastfeeding experience prior to this delivery?: Yes  Feeding Feeding Type: Breast Fed  LATCH Score                   Interventions    Lactation Tools Discussed/Used     Consult Status Consult Status: Follow-up Date: 05/08/18 Follow-up type: In-patient    Ave Filter 05/07/2018, 12:12 PM

## 2018-05-08 ENCOUNTER — Other Ambulatory Visit: Payer: Self-pay

## 2018-05-08 NOTE — Progress Notes (Signed)
Patient is eating, ambulating, voiding.  Pain control is good.  Appropriate lochia, no complaints. +flatus  Vitals:   05/07/18 1700 05/07/18 1935 05/07/18 2208 05/08/18 0612  BP: 131/87 124/60 102/68 118/64  Pulse: 86 83 83 79  Resp: 18  18   Temp: 98.3 F (36.8 C) 98.1 F (36.7 C) 98.7 F (37.1 C) 98 F (36.7 C)  TempSrc: Oral  Oral   SpO2: 100% 100%  100%  Weight:      Height:        Fundus firm Inc: c/d/i, some dried blood left side of dressing, no red blood. Ext: no calf tenderness  Lab Results  Component Value Date   WBC 16.6 (H) 05/07/2018   HGB 10.6 (L) 05/07/2018   HCT 32.4 (L) 05/07/2018   MCV 88.0 05/07/2018   PLT 150 05/07/2018    --/--/O POS, O POS Performed at [facility], [place] [address], [place], [place] [address]  (02/28 1135)  A/P Post op day #2 s/p c/s Doing well, no complaints. Baby may be staying for jaundice, ok for mom to discharge if baby will be discharged.  Routine care.   Allyn Kenner

## 2018-05-08 NOTE — Lactation Note (Signed)
This note was copied from a Dana's chart. Lactation Consultation Note  Patient Name: Dana Garcia Today's Date: 05/08/2018   Follow up lactation: Dana Garcia now 82 hours old. Grandmother in room also.  Mom reports she has been mostly bottle feeding because she has no milk. Mom reports breasts do not feel full or heavy and she tried to pump and did not get anything.  Asked mom if she had tried hand expression and she reports she has and did not get anything with hand expression either.  Infant just fed formula.  Asked mom if I could try hand expression.  Mom in agreement.  Showed mom how to hand express and small drops of colostrum easily expressed. Fed small drops of colostrum to infant.  At first she gagged but then tolerated well.  Mom reports she was not doing hand expression right.  Urged her to offer the breast based on cues and 8 or more times day.  Discussed hand expression and spoon feeding past breastfeedings and offering breastmilk via spoon instead of formula. Urged mom to always offer the breast first.  Explained how colostrum harder and in smaller amounts and not easily removed with breastpump.  Explained to mom she could keep trying to use DEBP after breastfeedings but not to expect to get much at first.   Urged mom to call lactation as needed.  Maternal Data    Feeding Feeding Type: Bottle Fed - Formula Nipple Type: Slow - flow  LATCH Score                   Interventions    Lactation Tools Discussed/Used     Consult Status      Dana Garcia 05/08/2018, 1:36 PM

## 2018-05-09 MED ORDER — DOCUSATE SODIUM 100 MG PO CAPS: 100.0000 mg | ORAL_CAPSULE | Freq: Two times a day (BID) | ORAL | Status: DC | PRN

## 2018-05-09 MED ORDER — OXYCODONE-ACETAMINOPHEN 5-325 MG PO TABS: 1.0000 | ORAL_TABLET | Freq: Four times a day (QID) | ORAL | 0 refills | Status: DC | PRN

## 2018-05-09 MED ORDER — IBUPROFEN 600 MG PO TABS: 600.0000 mg | ORAL_TABLET | Freq: Four times a day (QID) | ORAL | 0 refills | Status: DC | PRN

## 2018-05-09 NOTE — Discharge Summary (Signed)
Obstetric Discharge Summary Reason for Admission: onset of labor Prenatal Procedures: NST and ultrasound Intrapartum Procedures: cesarean: low cervical, transverse Postpartum Procedures: none Complications-Operative and Postpartum: none Hemoglobin  Date Value Ref Range Status  05/07/2018 10.6 (L) 12.0 - 15.0 g/dL Final  10/19/2017 12.8 11.1 - 15.9 g/dL Final   HCT  Date Value Ref Range Status  05/07/2018 32.4 (L) 36.0 - 46.0 % Final   Hematocrit  Date Value Ref Range Status  10/19/2017 38.1 34.0 - 46.6 % Final    Physical Exam:  General: alert, cooperative and appears stated age 33: appropriate Uterine Fundus: firm Incision: healing well DVT Evaluation: No evidence of DVT seen on physical exam.  Discharge Diagnoses: Term Pregnancy-delivered  Discharge Information: Date: 05/09/2018 Activity: pelvic rest Diet: routine Medications: Ibuprofen, Colace and Percocet Condition: improved Instructions: refer to practice specific booklet Discharge to: home   Newborn Data: Live born female  Birth Weight: 8 lb 3.4 oz (3725 g) APGAR: 8, 8  Newborn Delivery   Birth date/time:  [birth year]/2018 22:36:00 Delivery type:  C-Section, Low Transverse Trial of labor:  Yes C-section categorization:  Primary     Home with mother.  Vanessa Kick 05/09/2018, 9:31 AM

## 2018-10-18 ENCOUNTER — Other Ambulatory Visit: Payer: Self-pay

## 2018-10-18 ENCOUNTER — Encounter (HOSPITAL_COMMUNITY): Payer: Self-pay | Admitting: *Deleted

## 2018-10-18 ENCOUNTER — Emergency Department (HOSPITAL_COMMUNITY)
Admission: EM | Admit: 2018-10-18 | Discharge: 2018-10-19 | Disposition: A | Discharge status: Home / Self Care | Payer: BC Managed Care – PPO | Attending: Emergency Medicine | Admitting: Emergency Medicine

## 2018-10-18 DIAGNOSIS — Z79899 Other long term (current) drug therapy: Secondary | ICD-10-CM | POA: Diagnosis not present

## 2018-10-18 DIAGNOSIS — R197 Diarrhea, unspecified: Secondary | ICD-10-CM | POA: Diagnosis not present

## 2018-10-18 DIAGNOSIS — R109 Unspecified abdominal pain: Secondary | ICD-10-CM | POA: Diagnosis present

## 2018-10-18 LAB — CBC
HCT: 44.1 % (ref 36.0–46.0)
Hemoglobin: 14.7 g/dL (ref 12.0–15.0)
MCH: 27.8 pg (ref 26.0–34.0)
MCHC: 33.3 g/dL (ref 30.0–36.0)
MCV: 83.4 fL (ref 80.0–100.0)
Platelets: 202 10*3/uL (ref 150–400)
RBC: 5.29 MIL/uL — ABNORMAL HIGH (ref 3.87–5.11)
RDW: 13.5 % (ref 11.5–15.5)
WBC: 6.4 10*3/uL (ref 4.0–10.5)
nRBC: 0 % (ref 0.0–0.2)

## 2018-10-18 MED ORDER — SODIUM CHLORIDE 0.9% FLUSH: 3.0000 mL | Freq: Once | INTRAVENOUS | Status: DC

## 2018-10-18 NOTE — ED Triage Notes (Signed)
Pt c/o bodyaches, abd pain with diarrhea, and fever today. Took Tylenol around 2220

## 2018-10-19 DIAGNOSIS — R197 Diarrhea, unspecified: Secondary | ICD-10-CM | POA: Diagnosis not present

## 2018-10-19 LAB — COMPREHENSIVE METABOLIC PANEL
ALT: 19 U/L (ref 0–44)
AST: 20 U/L (ref 15–41)
Albumin: 3.9 g/dL (ref 3.5–5.0)
Alkaline Phosphatase: 46 U/L (ref 38–126)
Anion gap: 10 (ref 5–15)
BUN: 12 mg/dL (ref 6–20)
CO2: 24 mmol/L (ref 22–32)
Calcium: 9.3 mg/dL (ref 8.9–10.3)
Chloride: 104 mmol/L (ref 98–111)
Creatinine, Ser: 0.86 mg/dL (ref 0.44–1.00)
GFR calc Af Amer: >60 mL/min (ref >60)
GFR calc non Af Amer: >60 mL/min (ref >60)
Glucose, Bld: 97 mg/dL (ref 70–99)
Potassium: 3.8 mmol/L (ref 3.5–5.1)
Sodium: 138 mmol/L (ref 135–145)
Total Bilirubin: 0.6 mg/dL (ref 0.3–1.2)
Total Protein: 7.4 g/dL (ref 6.5–8.1)

## 2018-10-19 LAB — URINALYSIS, ROUTINE W REFLEX MICROSCOPIC
Bilirubin Urine: NEGATIVE
Glucose, UA: NEGATIVE mg/dL
Hgb urine dipstick: NEGATIVE
Ketones, ur: NEGATIVE mg/dL
Leukocytes,Ua: NEGATIVE
Nitrite: NEGATIVE
Protein, ur: NEGATIVE mg/dL
Specific Gravity, Urine: 1.032 — ABNORMAL HIGH (ref 1.005–1.030)
pH: 5 (ref 5.0–8.0)

## 2018-10-19 LAB — I-STAT BETA HCG BLOOD, ED (MC, WL, AP ONLY): I-stat hCG, quantitative: <5 m[IU]/mL (ref <5)

## 2018-10-19 LAB — LIPASE, BLOOD: Lipase: 29 U/L (ref 11–51)

## 2018-10-19 MED ORDER — KETOROLAC TROMETHAMINE 30 MG/ML IJ SOLN
30.0000 mg | Freq: Once | INTRAMUSCULAR | Status: AC
  Administered 2018-10-19: 30 mg via INTRAVENOUS
  Filled 2018-10-19: qty 1

## 2018-10-19 MED ORDER — DICYCLOMINE HCL 20 MG PO TABS: 20.0000 mg | ORAL_TABLET | Freq: Two times a day (BID) | ORAL | 0 refills | Status: DC | PRN

## 2018-10-19 MED ORDER — SODIUM CHLORIDE 0.9 % IV BOLUS
1000.0000 mL | Freq: Once | INTRAVENOUS | Status: AC
  Administered 2018-10-19: 1000 mL via INTRAVENOUS

## 2018-10-19 NOTE — ED Notes (Signed)
Pt. Took 500mg  tylenol around 2230.

## 2018-10-19 NOTE — ED Provider Notes (Signed)
[facility] EMERGENCY DEPARTMENT Provider Note   CSN: [id number] Arrival date & time: 10/18/18  2310    History   Chief Complaint Chief Complaint  Patient presents with  . Abdominal Pain    HPI Dana Garcia is a 33 y.o. female.     33 year old female presents to the emergency department for evaluation of abdominal cramping which began yesterday morning.  It has been fairly constant and waxing and waning in severity.  She typically notes worsening cramping prior to onset of a bowel movement.  Her bowel movements have been consistent with diarrhea; watery, nonbloody.  Had a headache into the evening, but this resolved with Tylenol.  Tylenol has not helped improve her abdominal discomfort.  Denies recent antibiotic use, recent travel, ingestion from questionable water source, food contamination.  Abdominal surgical history significant for cesarean section x1.  She has not had any associated fever, cough, congestion, sore throat, chest pain, shortness of breath, urinary symptoms, vaginal complaints since onset of abdominal cramping yesterday.  The history is provided by the patient. No language interpreter was used.  Abdominal Pain Pain location: lower abdomen. Pain quality: cramping   Pain radiates to:  Does not radiate Pain severity:  Mild Onset quality:  Gradual Duration:  1 day Timing:  Constant Progression:  Waxing and waning Chronicity:  New Relieved by:  Nothing Exacerbated by: PO intake. Ineffective treatments:  Acetaminophen Associated symptoms: diarrhea (watery, nonbloody)   Associated symptoms: no cough, no dysuria, no fever (Tmax 100.50F), no hematochezia, no melena, no sore throat, no vaginal bleeding, no vaginal discharge and no vomiting   Diarrhea:    Number of occurrences:  6   Duration:  1 day   Timing:  Intermittent   Progression:  Unchanged   Past Medical History:  Diagnosis Date  . Medical history non-contributory     Patient  Active Problem List   Diagnosis Date Noted  . Indication for care in labor or delivery 05/06/2018  . Abdominal pain affecting pregnancy 11/19/2017  . Edema during pregnancy in second trimester 11/19/2017  . 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 Supervision of other normal pregnancy, antepartum 10/19/2017    Past Surgical History:  Procedure Laterality Date  . CESAREAN SECTION N/A 05/06/2018   Procedure: CESAREAN SECTION;  Surgeon: Allyn Kenner, DO;  Location: [facility] LD ORS;  Service: Obstetrics;  Laterality: N/A;  . DILATION AND CURETTAGE OF UTERUS    . THYROIDECTOMY, PARTIAL       OB History    Gravida  3   Para  2   Term  2   Preterm      AB  1   Living  2     SAB      TAB  1   Ectopic      Multiple  0   Live Births  2            Home Medications    Prior to Admission medications   Medication Sig Start Date End Date Taking? Authorizing Provider  dicyclomine (BENTYL) 20 MG tablet Take 1 tablet (20 mg total) by mouth every 12 (twelve) hours as needed (for abdominal pain/cramping). 10/19/18   Antonietta Breach, PA-C  ibuprofen (ADVIL,MOTRIN) 600 MG tablet Take 1 tablet (600 mg total) by mouth every 6 (six) hours as needed. 05/09/18   Vanessa Kick, MD  oxyCODONE-acetaminophen (PERCOCET/ROXICET) 5-325 MG tablet Take 1-2 tablets by mouth every 6 (six) hours as needed for severe pain. 05/09/18  Vanessa Kick, MD  Prenatal Vit-Fe Fumarate-FA (PREPLUS) 27-1 MG TABS Take 1 tablet by mouth daily.    [provider]    Family History Family History  Problem Relation Age of Onset  . Hypertension Other   . Diabetes Other   . Hypertension Mother   . Diabetes Maternal Grandmother   . Diabetes Maternal Grandfather   . Kidney disease Paternal Grandmother     Social History Social History   Tobacco Use  . Smoking status: Never Smoker  . Smokeless tobacco: Never Used  Substance Use Topics  . Alcohol use: Not Currently    Comment: social  . Drug use:  Not Currently    Types: Marijuana    Comment: last in 2005     Allergies   Flagyl [metronidazole] and Septra [sulfamethoxazole-trimethoprim]   Review of Systems Review of Systems  Constitutional: Negative for fever (Tmax 100.86F).  HENT: Negative for sore throat.   Respiratory: Negative for cough.   Gastrointestinal: Positive for abdominal pain and diarrhea (watery, nonbloody). Negative for hematochezia, melena and vomiting.  Genitourinary: Negative for dysuria, vaginal bleeding and vaginal discharge.  Ten systems reviewed and are negative for acute change, except as noted in the HPI.    Physical Exam Updated Vital Signs BP 127/75   Pulse 76   Temp 99.1 F (37.3 C) (Oral)   Resp 18   SpO2 98%   Physical Exam Vitals signs and nursing note reviewed.  Constitutional:      General: She is not in acute distress.    Appearance: She is well-developed. She is not diaphoretic.     Comments: Nontoxic-appearing and in no acute distress  HENT:     Head: Normocephalic and atraumatic.  Eyes:     General: No scleral icterus.    Conjunctiva/sclera: Conjunctivae normal.  Neck:     Musculoskeletal: Normal range of motion.  Pulmonary:     Effort: Pulmonary effort is normal. No respiratory distress.     Comments: Respirations even and unlabored Abdominal:     Comments: Very mild tenderness in the left lower quadrant.  Exam is otherwise benign with no palpable masses or peritoneal signs.  Musculoskeletal: Normal range of motion.  Skin:    General: Skin is warm and dry.     Coloration: Skin is not pale.     Findings: No erythema or rash.  Neurological:     Mental Status: She is alert and oriented to person, place, and time.  Psychiatric:        Behavior: Behavior normal.      ED Treatments / Results  Labs (all labs ordered are listed, but only abnormal results are displayed) Labs Reviewed  CBC - Abnormal; Notable for the following components:      Result Value   RBC 5.29  (*)    All other components within normal limits  URINALYSIS, ROUTINE W REFLEX MICROSCOPIC - Abnormal; Notable for the following components:   APPearance HAZY (*)    Specific Gravity, Urine 1.032 (*)    All other components within normal limits  LIPASE, BLOOD  COMPREHENSIVE METABOLIC PANEL  I-STAT BETA HCG BLOOD, ED ([facility], [facility], [facility] ONLY)    EKG None  Radiology No results found.  Procedures Procedures (including critical care time)  Medications Ordered in ED Medications  sodium chloride flush (NS) 0.9 % injection 3 mL (has no administration in time range)  ketorolac (TORADOL) 30 MG/ML injection 30 mg (30 mg Intravenous Given 10/19/18 0305)  sodium chloride 0.9 %  bolus 1,000 mL (1,000 mLs Intravenous New Bag/Given 10/19/18 0304)    4:08 AM Patient reports symptomatic improvement on repeat assessment.  Expresses comfort with discharge.  Tachycardia has resolved.   Initial Impression / Assessment and Plan / ED Course  I have reviewed the triage vital signs and the nursing notes.  Pertinent labs & imaging results that were available during my care of the patient were reviewed by me and considered in my medical decision making (see chart for details).        Patient with symptoms consistent with viral diarrheal illness.  Vitals are stable, no fever.  Mild tachycardia on arrival suggestive of dehydration.  This has improved following IV fluids.  Lungs are clear.  Abdominal exam is fairly benign.  Stable on repeat exam.  Presently, no concern for emergent abdominal or pelvic process.  Labs without leukocytosis or electrolyte derangements.  Liver and kidney function preserved.  Supportive therapy indicated with return if symptoms worsen.  Encouraged follow-up with her primary care doctor.  Return precautions discussed and provided. Patient discharged in stable condition with no unaddressed concerns.   Final Clinical Impressions(s) / ED Diagnoses   Final diagnoses:  Diarrhea,  unspecified type  Abdominal cramping    ED Discharge Orders         Ordered    dicyclomine (BENTYL) 20 MG tablet  Every 12 hours PRN     10/19/18 0406           Antonietta Breach, PA-C 10/19/18 0410    Varney Biles, MD 10/19/18 [phone]

## 2018-10-19 NOTE — Discharge Instructions (Signed)
Your symptoms are likely due to a viral illness.  We recommend follow-up with your primary care doctor.  You have been prescribed Bentyl to take for management of abdominal cramping.  Use this with an over-the-counter probiotic which can be purchased at your local pharmacy.  Drink plenty of fluids to prevent dehydration.  You may return for any new or concerning symptoms.

## 2018-10-19 NOTE — ED Notes (Signed)
Patient verbalized understanding of discharge instructions. Opportunity for questions were provided. Pt. ambulatory and discharged home.  

## 2018-10-20 ENCOUNTER — Emergency Department (HOSPITAL_COMMUNITY)
Admission: EM | Admit: 2018-10-20 | Discharge: 2018-10-21 | Disposition: A | Discharge status: Home / Self Care | Payer: BC Managed Care – PPO | Attending: Emergency Medicine | Admitting: Emergency Medicine

## 2018-10-20 ENCOUNTER — Other Ambulatory Visit: Payer: Self-pay

## 2018-10-20 DIAGNOSIS — R1030 Lower abdominal pain, unspecified: Secondary | ICD-10-CM | POA: Insufficient documentation

## 2018-10-20 DIAGNOSIS — R197 Diarrhea, unspecified: Secondary | ICD-10-CM | POA: Diagnosis not present

## 2018-10-20 DIAGNOSIS — Z20822 Contact with and (suspected) exposure to covid-19: Secondary | ICD-10-CM

## 2018-10-20 DIAGNOSIS — M7918 Myalgia, other site: Secondary | ICD-10-CM | POA: Diagnosis not present

## 2018-10-20 DIAGNOSIS — E876 Hypokalemia: Secondary | ICD-10-CM | POA: Diagnosis not present

## 2018-10-20 DIAGNOSIS — Z20828 Contact with and (suspected) exposure to other viral communicable diseases: Secondary | ICD-10-CM | POA: Diagnosis not present

## 2018-10-20 DIAGNOSIS — R51 Headache: Secondary | ICD-10-CM | POA: Diagnosis not present

## 2018-10-20 LAB — COMPREHENSIVE METABOLIC PANEL
ALT: 23 U/L (ref 0–44)
AST: 35 U/L (ref 15–41)
Albumin: 3.6 g/dL (ref 3.5–5.0)
Alkaline Phosphatase: 43 U/L (ref 38–126)
Anion gap: 7 (ref 5–15)
BUN: 6 mg/dL (ref 6–20)
CO2: 24 mmol/L (ref 22–32)
Calcium: 8.3 mg/dL — ABNORMAL LOW (ref 8.9–10.3)
Chloride: 105 mmol/L (ref 98–111)
Creatinine, Ser: 0.97 mg/dL (ref 0.44–1.00)
GFR calc Af Amer: >60 mL/min (ref >60)
GFR calc non Af Amer: >60 mL/min (ref >60)
Glucose, Bld: 102 mg/dL — ABNORMAL HIGH (ref 70–99)
Potassium: 3.1 mmol/L — ABNORMAL LOW (ref 3.5–5.1)
Sodium: 136 mmol/L (ref 135–145)
Total Bilirubin: 0.6 mg/dL (ref 0.3–1.2)
Total Protein: 7 g/dL (ref 6.5–8.1)

## 2018-10-20 LAB — CBC WITH DIFFERENTIAL/PLATELET
Abs Immature Granulocytes: 0.01 10*3/uL (ref 0.00–0.07)
Basophils Absolute: 0 10*3/uL (ref 0.0–0.1)
Basophils Relative: 0 %
Eosinophils Absolute: 0 10*3/uL (ref 0.0–0.5)
Eosinophils Relative: 0 %
HCT: 42.5 % (ref 36.0–46.0)
Hemoglobin: 14.1 g/dL (ref 12.0–15.0)
Immature Granulocytes: 0 %
Lymphocytes Relative: 25 %
Lymphs Abs: 1 10*3/uL (ref 0.7–4.0)
MCH: 27.9 pg (ref 26.0–34.0)
MCHC: 33.2 g/dL (ref 30.0–36.0)
MCV: 84 fL (ref 80.0–100.0)
Monocytes Absolute: 0.4 10*3/uL (ref 0.1–1.0)
Monocytes Relative: 8 %
Neutro Abs: 2.8 10*3/uL (ref 1.7–7.7)
Neutrophils Relative %: 67 %
Platelets: 155 10*3/uL (ref 150–400)
RBC: 5.06 MIL/uL (ref 3.87–5.11)
RDW: 13.6 % (ref 11.5–15.5)
WBC: 4.2 10*3/uL (ref 4.0–10.5)
nRBC: 0 % (ref 0.0–0.2)

## 2018-10-20 LAB — I-STAT BETA HCG BLOOD, ED (MC, WL, AP ONLY): I-stat hCG, quantitative: <5 m[IU]/mL (ref <5)

## 2018-10-20 LAB — LIPASE, BLOOD: Lipase: 33 U/L (ref 11–51)

## 2018-10-20 NOTE — ED Triage Notes (Signed)
Per pt was here on Tuesday and was given something for her abdominal pain but pt says not working. Pt also having fevers but taming tylenol to keep down.

## 2018-10-21 DIAGNOSIS — R1030 Lower abdominal pain, unspecified: Secondary | ICD-10-CM | POA: Diagnosis not present

## 2018-10-21 LAB — URINALYSIS, ROUTINE W REFLEX MICROSCOPIC
Bacteria, UA: NONE SEEN
Bilirubin Urine: NEGATIVE
Glucose, UA: NEGATIVE mg/dL
Hgb urine dipstick: NEGATIVE
Ketones, ur: NEGATIVE mg/dL
Leukocytes,Ua: NEGATIVE
Nitrite: NEGATIVE
Protein, ur: 100 mg/dL — AB
Specific Gravity, Urine: 1.03 (ref 1.005–1.030)
pH: 5 (ref 5.0–8.0)

## 2018-10-21 MED ORDER — ACETAMINOPHEN 500 MG PO TABS
1000.0000 mg | ORAL_TABLET | Freq: Once | ORAL | Status: AC
  Administered 2018-10-21: 1000 mg via ORAL
  Filled 2018-10-21: qty 2

## 2018-10-21 MED ORDER — ONDANSETRON 4 MG PO TBDP: ORAL_TABLET | ORAL | 0 refills | Status: DC

## 2018-10-21 MED ORDER — POTASSIUM CHLORIDE CRYS ER 20 MEQ PO TBCR
40.0000 meq | EXTENDED_RELEASE_TABLET | Freq: Once | ORAL | Status: AC
  Administered 2018-10-21: 40 meq via ORAL
  Filled 2018-10-21: qty 2

## 2018-10-21 MED ORDER — LOPERAMIDE HCL 2 MG PO CAPS: 2.0000 mg | ORAL_CAPSULE | Freq: Four times a day (QID) | ORAL | 0 refills | Status: DC | PRN

## 2018-10-21 NOTE — Discharge Instructions (Addendum)
1. Medications: zofran, imodium, usual home medications 2. Treatment: rest, drink plenty of fluids, advance diet slowly 3. Follow Up: Please followup with your primary doctor in 2 days for discussion of your diagnoses and further evaluation after today's visit; if you do not have a primary care doctor use the resource guide provided to find one; Please return to the ER for persistent vomiting, high fevers or worsening symptoms      Person Under Monitoring Name: Dana Garcia  Location: [place] [address]   Infection Prevention Recommendations for Individuals Confirmed to have, or Being Evaluated for, 2019 Novel Coronavirus (COVID-19) Infection Who Receive Care at Home  Individuals who are confirmed to have, or are being evaluated for, COVID-19 should follow the prevention steps below until a healthcare provider or local or state health department says they can return to normal activities.  Stay home except to get medical care You should restrict activities outside your home, except for getting medical care. Do not go to work, school, or public areas, and do not use public transportation or taxis.  Call ahead before visiting your doctor Before your medical appointment, call the healthcare provider and tell them that you have, or are being evaluated for, COVID-19 infection. This will help the healthcare providers office take steps to keep other people from getting infected. Ask your healthcare provider to call the local or state health department.  Monitor your symptoms Seek prompt medical attention if your illness is worsening (e.g., difficulty breathing). Before going to your medical appointment, call the healthcare provider and tell them that you have, or are being evaluated for, COVID-19 infection. Ask your healthcare provider to call the local or state health department.  Wear a facemask You should wear a facemask that covers your nose and mouth when  you are in the same room with other people and when you visit a healthcare provider. People who live with or visit you should also wear a facemask while they are in the same room with you.  Separate yourself from other people in your home As much as possible, you should stay in a different room from other people in your home. Also, you should use a separate bathroom, if available.  Avoid sharing household items You should not share dishes, drinking glasses, cups, eating utensils, towels, bedding, or other items with other people in your home. After using these items, you should wash them thoroughly with soap and water.  Cover your coughs and sneezes Cover your mouth and nose with a tissue when you cough or sneeze, or you can cough or sneeze into your sleeve. Throw used tissues in a lined trash can, and immediately wash your hands with soap and water for at least 20 seconds or use an alcohol-based hand rub.  Wash your [facility] your hands often and thoroughly with soap and water for at least 20 seconds. You can use an alcohol-based hand sanitizer if soap and water are not available and if your hands are not visibly dirty. Avoid touching your eyes, nose, and mouth with unwashed hands.   Prevention Steps for Caregivers and Household Members of Individuals Confirmed to have, or Being Evaluated for, COVID-19 Infection Being Cared for in the Home  If you live with, or provide care at home for, a person confirmed to have, or being evaluated for, COVID-19 infection please follow these guidelines to prevent infection:  Follow healthcare providers instructions Make sure that you understand and can help the patient  follow any healthcare provider instructions for all care.  Provide for the patients basic needs You should help the patient with basic needs in the home and provide support for getting groceries, prescriptions, and other personal needs.  Monitor the patients symptoms If they  are getting sicker, call his or her medical provider and tell them that the patient has, or is being evaluated for, COVID-19 infection. This will help the healthcare providers office take steps to keep other people from getting infected. Ask the healthcare provider to call the local or state health department.  Limit the number of people who have contact with the patient If possible, have only one caregiver for the patient. Other household members should stay in another home or place of residence. If this is not possible, they should stay in another room, or be separated from the patient as much as possible. Use a separate bathroom, if available. Restrict visitors who do not have an essential need to be in the home.  Keep older adults, very young children, and other sick people away from the patient Keep older adults, very young children, and those who have compromised immune systems or chronic health conditions away from the patient. This includes people with chronic heart, lung, or kidney conditions, diabetes, and cancer.  Ensure good ventilation Make sure that shared spaces in the home have good air flow, such as from an air conditioner or an opened window, weather permitting.  Wash your hands often Wash your hands often and thoroughly with soap and water for at least 20 seconds. You can use an alcohol based hand sanitizer if soap and water are not available and if your hands are not visibly dirty. Avoid touching your eyes, nose, and mouth with unwashed hands. Use disposable paper towels to dry your hands. If not available, use dedicated cloth towels and replace them when they become wet.  Wear a facemask and gloves Wear a disposable facemask at all times in the room and gloves when you touch or have contact with the patients blood, body fluids, and/or secretions or excretions, such as sweat, saliva, sputum, nasal mucus, vomit, urine, or feces.  Ensure the mask fits over your nose and  mouth tightly, and do not touch it during use. Throw out disposable facemasks and gloves after using them. Do not reuse. Wash your hands immediately after removing your facemask and gloves. If your personal clothing becomes contaminated, carefully remove clothing and launder. Wash your hands after handling contaminated clothing. Place all used disposable facemasks, gloves, and other waste in a lined container before disposing them with other household waste. Remove gloves and wash your hands immediately after handling these items.  Do not share dishes, glasses, or other household items with the patient Avoid sharing household items. You should not share dishes, drinking glasses, cups, eating utensils, towels, bedding, or other items with a patient who is confirmed to have, or being evaluated for, COVID-19 infection. After the person uses these items, you should wash them thoroughly with soap and water.  Wash laundry thoroughly Immediately remove and wash clothes or bedding that have blood, body fluids, and/or secretions or excretions, such as sweat, saliva, sputum, nasal mucus, vomit, urine, or feces, on them. Wear gloves when handling laundry from the patient. Read and follow directions on labels of laundry or clothing items and detergent. In general, wash and dry with the warmest temperatures recommended on the label.  Clean all areas the individual has used often Clean all touchable surfaces, such as  counters, tabletops, doorknobs, bathroom fixtures, toilets, phones, keyboards, tablets, and bedside tables, every day. Also, clean any surfaces that may have blood, body fluids, and/or secretions or excretions on them. Wear gloves when cleaning surfaces the patient has come in contact with. Use a diluted bleach solution (e.g., dilute bleach with 1 part bleach and 10 parts water) or a household disinfectant with a label that says EPA-registered for coronaviruses. To make a bleach solution at home,  add 1 tablespoon of bleach to 1 quart (4 cups) of water. For a larger supply, add  cup of bleach to 1 gallon (16 cups) of water. Read labels of cleaning products and follow recommendations provided on product labels. Labels contain instructions for safe and effective use of the cleaning product including precautions you should take when applying the product, such as wearing gloves or eye protection and making sure you have good ventilation during use of the product. Remove gloves and wash hands immediately after cleaning.  Monitor yourself for signs and symptoms of illness Caregivers and household members are considered close contacts, should monitor their health, and will be asked to limit movement outside of the home to the extent possible. Follow the monitoring steps for close contacts listed on the symptom monitoring form.   ? If you have additional questions, contact your local health department or call the epidemiologist on call at [phone] (available 24/7). ? This guidance is subject to change. For the most up-to-date guidance from [facility], please refer to their website: [email or web address]

## 2018-10-21 NOTE — ED Provider Notes (Signed)
[facility] EMERGENCY DEPARTMENT Provider Note   CSN: [id number] Arrival date & time: 10/20/18  1726    History   Chief Complaint Chief Complaint  Patient presents with  . Abdominal Pain    HPI BANA BORGMEYER is a 33 y.o. female with a hx of no major medical problems presents to the Emergency Department complaining of gradual, waxing and waning, persistent lower abdominal cramping with associated diarrhea onset 3 days ago.  Patient reports her diarrhea is watery and nonbloody.  She has had associated headache, low-grade fevers and myalgias.  She reports abdominal cramping is made worse after eating.  She denies recent antibiotic usage, travel, questionable food or water.  She denies known COVID contacts.  Abdominal surgical history consists of cesarean section x1.  Patient reports she is not sexually active and does not have vaginal discharge.  She is not concerned about possible STD.  Nothing seems to make her symptoms better.  Records reviewed.  Patient was evaluated for same 2 days ago with reassuring lab work.  No imaging was obtained at that time.  Viral etiology was considered and patient was discharged home.  Patient reports her concern today is that she may have COVID.     The history is provided by the patient and medical records. No language interpreter was used.    Past Medical History:  Diagnosis Date  . Medical history non-contributory     Patient Active Problem List   Diagnosis Date Noted  . Indication for care in labor or delivery 05/06/2018  . Abdominal pain affecting pregnancy 11/19/2017  . Edema during pregnancy in second trimester 11/19/2017  . 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 Supervision of other normal pregnancy, antepartum 10/19/2017    Past Surgical History:  Procedure Laterality Date  . CESAREAN SECTION N/A 05/06/2018   Procedure: CESAREAN SECTION;  Surgeon: Allyn Kenner, DO;  Location: [facility] LD ORS;  Service:  Obstetrics;  Laterality: N/A;  . DILATION AND CURETTAGE OF UTERUS    . THYROIDECTOMY, PARTIAL       OB History    Gravida  3   Para  2   Term  2   Preterm      AB  1   Living  2     SAB      TAB  1   Ectopic      Multiple  0   Live Births  2            Home Medications    Prior to Admission medications   Medication Sig Start Date End Date Taking? Authorizing Provider  dicyclomine (BENTYL) 20 MG tablet Take 1 tablet (20 mg total) by mouth every 12 (twelve) hours as needed (for abdominal pain/cramping). 10/19/18   Antonietta Breach, PA-C  ibuprofen (ADVIL,MOTRIN) 600 MG tablet Take 1 tablet (600 mg total) by mouth every 6 (six) hours as needed. 05/09/18   Vanessa Kick, MD  loperamide (IMODIUM) 2 MG capsule Take 1 capsule (2 mg total) by mouth 4 (four) times daily as needed for diarrhea or loose stools. 10/21/18   Keyaria Lawson, Jarrett Soho, PA-C  ondansetron (ZOFRAN ODT) 4 MG disintegrating tablet 4mg  ODT q4 hours prn nausea/vomit 10/21/18   Kelicia Youtz, Jarrett Soho, PA-C  oxyCODONE-acetaminophen (PERCOCET/ROXICET) 5-325 MG tablet Take 1-2 tablets by mouth every 6 (six) hours as needed for severe pain. 05/09/18   Vanessa Kick, MD  Prenatal Vit-Fe Fumarate-FA (PREPLUS) 27-1 MG TABS Take 1 tablet by mouth daily.    [provider]    Family History Family History  Problem Relation Age of Onset  . Hypertension Other   . Diabetes Other   . Hypertension Mother   . Diabetes Maternal Grandmother   . Diabetes Maternal Grandfather   . Kidney disease Paternal Grandmother     Social History Social History   Tobacco Use  . Smoking status: Never Smoker  . Smokeless tobacco: Never Used  Substance Use Topics  . Alcohol use: Not Currently    Comment: social  . Drug use: Not Currently    Types: Marijuana    Comment: last in 2005     Allergies   Flagyl [metronidazole] and Septra [sulfamethoxazole-trimethoprim]   Review of Systems Review of Systems  Constitutional:  Positive for fatigue and fever. Negative for appetite change, diaphoresis and unexpected weight change.  HENT: Negative for mouth sores.   Eyes: Negative for visual disturbance.  Respiratory: Negative for cough, chest tightness, shortness of breath and wheezing.   Cardiovascular: Negative for chest pain.  Gastrointestinal: Positive for abdominal pain and diarrhea. Negative for constipation, nausea and vomiting.  Endocrine: Negative for polydipsia, polyphagia and polyuria.  Genitourinary: Negative for dysuria, frequency, hematuria and urgency.  Musculoskeletal: Positive for myalgias. Negative for back pain and neck stiffness.  Skin: Negative for rash.  Allergic/Immunologic: Negative for immunocompromised state.  Neurological: Negative for syncope, light-headedness and headaches.  Hematological: Does not bruise/bleed easily.  Psychiatric/Behavioral: Negative for sleep disturbance. The patient is not nervous/anxious.      Physical Exam Updated Vital Signs BP 115/76 (BP Location: Left Arm)   Pulse 87   Temp 99.3 F (37.4 C)   Resp 18   SpO2 98%   Physical Exam Vitals signs and nursing note reviewed.  Constitutional:      General: She is not in acute distress.    Appearance: She is not diaphoretic.  HENT:     Head: Normocephalic.  Eyes:     General: No scleral icterus.    Conjunctiva/sclera: Conjunctivae normal.  Neck:     Musculoskeletal: Normal range of motion.  Cardiovascular:     Rate and Rhythm: Normal rate and regular rhythm.     Pulses: Normal pulses.          Radial pulses are 2+ on the right side and 2+ on the left side.  Pulmonary:     Effort: No tachypnea, accessory muscle usage, prolonged expiration, respiratory distress or retractions.     Breath sounds: No stridor.     Comments: Equal chest rise. No increased work of breathing. Abdominal:     General: There is no distension.     Palpations: Abdomen is soft.     Tenderness: There is no abdominal tenderness.  There is no right CVA tenderness, left CVA tenderness, guarding or rebound. Negative signs include Murphy's sign and McBurney's sign.     Hernia: No hernia is present.  Musculoskeletal:     Comments: Moves all extremities equally and without difficulty.  Skin:    General: Skin is warm and dry.     Capillary Refill: Capillary refill takes less than 2 seconds.  Neurological:     Mental Status: She is alert.     GCS: GCS eye subscore is 4. GCS verbal subscore is 5. GCS motor subscore is 6.     Comments: Speech is clear and goal oriented.  Psychiatric:        Mood and Affect: Mood normal.      ED Treatments / Results  Labs (all labs ordered are listed, but only abnormal results are displayed) Labs Reviewed  COMPREHENSIVE METABOLIC PANEL - Abnormal; Notable for the following components:      Result Value   Potassium 3.1 (*)    Glucose, Bld 102 (*)    Calcium 8.3 (*)    All other components within normal limits  URINALYSIS, ROUTINE W REFLEX MICROSCOPIC - Abnormal; Notable for the following components:   APPearance CLOUDY (*)    Protein, ur 100 (*)    All other components within normal limits  NOVEL CORONAVIRUS, NAA (HOSPITAL ORDER, SEND-OUT TO REF LAB)  CBC WITH DIFFERENTIAL/PLATELET  LIPASE, BLOOD  LACTIC ACID, PLASMA  LACTIC ACID, PLASMA  I-STAT BETA HCG BLOOD, ED ([facility], [facility], [facility] ONLY)    Procedures Procedures (including critical care time)  Medications Ordered in ED Medications  acetaminophen (TYLENOL) tablet 1,000 mg (1,000 mg Oral Given 10/21/18 0101)  potassium chloride SA (K-DUR) CR tablet 40 mEq (40 mEq Oral Given 10/21/18 0100)     Initial Impression / Assessment and Plan / ED Course  I have reviewed the triage vital signs and the nursing notes.  Pertinent labs & imaging results that were available during my care of the patient were reviewed by me and considered in my medical decision making (see chart for details).         SUBRINA VECCHIARELLI was evaluated in  Emergency Department on 10/21/2018 for the symptoms described in the history of present illness. She was evaluated in the context of the global COVID-19 pandemic, which necessitated consideration that the patient might be at risk for infection with the SARS-CoV-2 virus that causes COVID-19. Institutional protocols and algorithms that pertain to the evaluation of patients at risk for COVID-19 are in a state of rapid change based on information released by regulatory bodies including the CDC and federal and state organizations. These policies and algorithms were followed during the patient's care in the ED.   Pt presents with abdominal cramping for several days with associated diarrhea.  Decreased appetite, myalgias and fevers without vomiting.  On exam, abdomen is soft and nontender.  No rebound or guarding.  No focal pain.  Labs are largely reassuring.  Mild hypokalemia is noted - replaced in the ED. patient with moist mucous membranes, no signs of dehydration and tolerating p.o. here in the emergency department.  UA without evidence of urinary tract infection.  Given patient's collection of symptoms I feel her abdominal pain is most likely from COVID-19.  Less likely to be diverticulitis, appendicitis, PID.  Patient denies vaginal discharge or sexual activity.  Pregnancy test negative.  COVID test sent.  Patient is to return to the emergency department if her symptoms persist or worsen.  She is to return immediately for symptoms that become more focal in nature.  Did discuss potential for early appendicitis but given normal lab work, nontender exam and nonfocal pain I think this is less likely I do not feel CT scan at this time will provide significant benefit.  Patient is comfortable with this decision.  Questions answered and patient states understanding.   Final Clinical Impressions(s) / ED Diagnoses   Final diagnoses:  Lower abdominal pain  Suspected Covid-19 Virus Infection  Hypokalemia    ED  Discharge Orders         Ordered    ondansetron (ZOFRAN ODT) 4 MG disintegrating tablet     10/21/18 0101    loperamide (IMODIUM) 2 MG capsule  4 times daily PRN  10/21/18 0101           Frances Ambrosino, Jarrett Soho, PA-C 10/21/18 1601    Palumbo, April, MD 10/21/18 0932

## 2018-10-22 LAB — NOVEL CORONAVIRUS, NAA (HOSP ORDER, SEND-OUT TO REF LAB; TAT 18-24 HRS): SARS-CoV-2, NAA: NOT DETECTED

## 2019-01-04 ENCOUNTER — Encounter (HOSPITAL_COMMUNITY): Payer: Self-pay

## 2019-01-04 ENCOUNTER — Emergency Department (HOSPITAL_COMMUNITY): Payer: BC Managed Care – PPO

## 2019-01-04 ENCOUNTER — Other Ambulatory Visit: Payer: Self-pay

## 2019-01-04 ENCOUNTER — Emergency Department (HOSPITAL_COMMUNITY)
Admission: EM | Admit: 2019-01-04 | Discharge: 2019-01-04 | Disposition: A | Discharge status: Home / Self Care | Payer: BC Managed Care – PPO | Attending: Emergency Medicine | Admitting: Emergency Medicine

## 2019-01-04 DIAGNOSIS — K8020 Calculus of gallbladder without cholecystitis without obstruction: Secondary | ICD-10-CM | POA: Diagnosis not present

## 2019-01-04 DIAGNOSIS — R1011 Right upper quadrant pain: Secondary | ICD-10-CM | POA: Diagnosis present

## 2019-01-04 LAB — COMPREHENSIVE METABOLIC PANEL
ALT: 97 U/L — ABNORMAL HIGH (ref 0–44)
AST: 153 U/L — ABNORMAL HIGH (ref 15–41)
Albumin: 4.2 g/dL (ref 3.5–5.0)
Alkaline Phosphatase: 62 U/L (ref 38–126)
Anion gap: 11 (ref 5–15)
BUN: 14 mg/dL (ref 6–20)
CO2: 24 mmol/L (ref 22–32)
Calcium: 9.7 mg/dL (ref 8.9–10.3)
Chloride: 103 mmol/L (ref 98–111)
Creatinine, Ser: 0.81 mg/dL (ref 0.44–1.00)
GFR calc Af Amer: >60 mL/min (ref >60)
GFR calc non Af Amer: >60 mL/min (ref >60)
Glucose, Bld: 133 mg/dL — ABNORMAL HIGH (ref 70–99)
Potassium: 4.2 mmol/L (ref 3.5–5.1)
Sodium: 138 mmol/L (ref 135–145)
Total Bilirubin: 0.9 mg/dL (ref 0.3–1.2)
Total Protein: 7.9 g/dL (ref 6.5–8.1)

## 2019-01-04 LAB — LIPASE, BLOOD: Lipase: 26 U/L (ref 11–51)

## 2019-01-04 LAB — CBC WITH DIFFERENTIAL/PLATELET
Abs Immature Granulocytes: 0.05 10*3/uL (ref 0.00–0.07)
Basophils Absolute: 0 10*3/uL (ref 0.0–0.1)
Basophils Relative: 0 %
Eosinophils Absolute: 0 10*3/uL (ref 0.0–0.5)
Eosinophils Relative: 0 %
HCT: 41.9 % (ref 36.0–46.0)
Hemoglobin: 14 g/dL (ref 12.0–15.0)
Immature Granulocytes: 0 %
Lymphocytes Relative: 9 %
Lymphs Abs: 1.1 10*3/uL (ref 0.7–4.0)
MCH: 28.9 pg (ref 26.0–34.0)
MCHC: 33.4 g/dL (ref 30.0–36.0)
MCV: 86.6 fL (ref 80.0–100.0)
Monocytes Absolute: 0.6 10*3/uL (ref 0.1–1.0)
Monocytes Relative: 4 %
Neutro Abs: 10.9 10*3/uL — ABNORMAL HIGH (ref 1.7–7.7)
Neutrophils Relative %: 87 %
Platelets: 221 10*3/uL (ref 150–400)
RBC: 4.84 MIL/uL (ref 3.87–5.11)
RDW: 13.6 % (ref 11.5–15.5)
WBC: 12.7 10*3/uL — ABNORMAL HIGH (ref 4.0–10.5)
nRBC: 0 % (ref 0.0–0.2)

## 2019-01-04 LAB — I-STAT BETA HCG BLOOD, ED (MC, WL, AP ONLY): I-stat hCG, quantitative: <5 m[IU]/mL (ref <5)

## 2019-01-04 LAB — URINALYSIS, ROUTINE W REFLEX MICROSCOPIC
Bilirubin Urine: NEGATIVE
Glucose, UA: NEGATIVE mg/dL
Hgb urine dipstick: NEGATIVE
Ketones, ur: NEGATIVE mg/dL
Leukocytes,Ua: NEGATIVE
Nitrite: NEGATIVE
Protein, ur: NEGATIVE mg/dL
Specific Gravity, Urine: >1.046 — ABNORMAL HIGH (ref 1.005–1.030)
pH: 7 (ref 5.0–8.0)

## 2019-01-04 MED ORDER — MORPHINE SULFATE (PF) 4 MG/ML IV SOLN
4.0000 mg | Freq: Once | INTRAVENOUS | Status: AC
  Administered 2019-01-04: 11:00:00 4 mg via INTRAVENOUS
  Filled 2019-01-04: qty 1

## 2019-01-04 MED ORDER — OXYCODONE-ACETAMINOPHEN 5-325 MG PO TABS: 2.0000 | ORAL_TABLET | Freq: Four times a day (QID) | ORAL | 0 refills | Status: DC | PRN

## 2019-01-04 MED ORDER — MORPHINE SULFATE (PF) 4 MG/ML IV SOLN
4.0000 mg | Freq: Once | INTRAVENOUS | Status: AC
  Administered 2019-01-04: 4 mg via INTRAVENOUS
  Filled 2019-01-04: qty 1

## 2019-01-04 MED ORDER — ONDANSETRON HCL 4 MG/2ML IJ SOLN
4.0000 mg | Freq: Once | INTRAMUSCULAR | Status: AC
  Administered 2019-01-04: 11:00:00 4 mg via INTRAVENOUS
  Filled 2019-01-04: qty 2

## 2019-01-04 MED ORDER — PIPERACILLIN-TAZOBACTAM 3.375 G IVPB 30 MIN
3.3750 g | Freq: Once | INTRAVENOUS | Status: AC
  Administered 2019-01-04: 12:00:00 3.375 g via INTRAVENOUS
  Filled 2019-01-04: qty 50

## 2019-01-04 MED ORDER — SODIUM CHLORIDE 0.9 % IV BOLUS
1000.0000 mL | Freq: Once | INTRAVENOUS | Status: AC
  Administered 2019-01-04: 11:00:00 1000 mL via INTRAVENOUS

## 2019-01-04 MED ORDER — SODIUM CHLORIDE (PF) 0.9 % IJ SOLN
INTRAMUSCULAR | Status: AC
  Filled 2019-01-04: qty 50

## 2019-01-04 MED ORDER — IOHEXOL 300 MG/ML  SOLN
100.0000 mL | Freq: Once | INTRAMUSCULAR | Status: AC | PRN
  Administered 2019-01-04: 12:00:00 100 mL via INTRAVENOUS

## 2019-01-04 MED ORDER — ONDANSETRON HCL 4 MG PO TABS: 4.0000 mg | ORAL_TABLET | Freq: Four times a day (QID) | ORAL | 0 refills | Status: DC

## 2019-01-04 NOTE — ED Provider Notes (Signed)
[facility] DEPT Provider Note   CSN: [id number] Arrival date & time: 01/04/19  [id number]     History   Chief Complaint Chief Complaint  Patient presents with  . Abdominal Pain    HPI Dana GRZESKOWIAK is a 33 y.o. female who presents to the ED today complaining of sudden onset, constant, sharp, diffuse abdominal pain that began around 3:30 AM this morning.  She reports it woke her out of sleep today.  She is also planing of nausea and nonbloody nonbilious emesis.  Reports 2 episodes of emesis today with most recent one 2 to 3 hours ago.  States that she has not taken anything for her pain.  Patient states that she has had similar symptoms in the past but has never been this severe.  No suspicious food intake.  States she ate chicken wings last night.  States other people in the house ate it and did not have symptoms today.  No recent antibiotic use.  Denies heavy alcohol use.  Denies fever, chills, diarrhea, constipation, dysuria, urinary frequency, vaginal discharge, pelvic pain, any other associated symptoms.  Patient has an IUD in place.  States she does not get periods with this.        Past Medical History:  Diagnosis Date  . Medical history non-contributory     Patient Active Problem List   Diagnosis Date Noted  . Indication for care in labor or delivery 05/06/2018  . Abdominal pain affecting pregnancy 11/19/2017  . Edema during pregnancy in second trimester 11/19/2017  . 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 Supervision of other normal pregnancy, antepartum 10/19/2017    Past Surgical History:  Procedure Laterality Date  . CESAREAN SECTION N/A 05/06/2018   Procedure: CESAREAN SECTION;  Surgeon: Allyn Kenner, DO;  Location: [facility] LD ORS;  Service: Obstetrics;  Laterality: N/A;  . DILATION AND CURETTAGE OF UTERUS    . THYROIDECTOMY, PARTIAL       OB History    Gravida  3   Para  2   Term  2   Preterm      AB  1   Living   2     SAB      TAB  1   Ectopic      Multiple  0   Live Births  2            Home Medications    Prior to Admission medications   Medication Sig Start Date End Date Taking? Authorizing Provider  dicyclomine (BENTYL) 20 MG tablet Take 1 tablet (20 mg total) by mouth every 12 (twelve) hours as needed (for abdominal pain/cramping). Patient not taking: Reported on 01/04/2019 10/19/18   Antonietta Breach, PA-C  ibuprofen (ADVIL,MOTRIN) 600 MG tablet Take 1 tablet (600 mg total) by mouth every 6 (six) hours as needed. Patient not taking: Reported on 01/04/2019 05/09/18   Vanessa Kick, MD  loperamide (IMODIUM) 2 MG capsule Take 1 capsule (2 mg total) by mouth 4 (four) times daily as needed for diarrhea or loose stools. Patient not taking: Reported on 01/04/2019 10/21/18   Muthersbaugh, Jarrett Soho, PA-C  ondansetron (ZOFRAN ODT) 4 MG disintegrating tablet 4mg  ODT q4 hours prn nausea/vomit Patient not taking: Reported on 01/04/2019 10/21/18   Muthersbaugh, Jarrett Soho, PA-C  ondansetron (ZOFRAN) 4 MG tablet Take 1 tablet (4 mg total) by mouth every 6 (six) hours. 01/04/19   Eustaquio Maize, PA-C  oxyCODONE-acetaminophen (PERCOCET/ROXICET) 5-325 MG tablet Take 2 tablets by mouth every 6 (  six) hours as needed for severe pain. 01/04/19   Eustaquio Maize, PA-C    Family History Family History  Problem Relation Age of Onset  . Hypertension Other   . Diabetes Other   . Hypertension Mother   . Diabetes Maternal Grandmother   . Diabetes Maternal Grandfather   . Kidney disease Paternal Grandmother     Social History Social History   Tobacco Use  . Smoking status: Never Smoker  . Smokeless tobacco: Never Used  Substance Use Topics  . Alcohol use: Not Currently    Comment: social  . Drug use: Not Currently    Types: Marijuana    Comment: last in 2005     Allergies   Flagyl [metronidazole] and Septra [sulfamethoxazole-trimethoprim]   Review of Systems Review of Systems  Constitutional:  Negative for chills and fever.  HENT: Negative for congestion.   Eyes: Negative for visual disturbance.  Respiratory: Negative for cough and shortness of breath.   Cardiovascular: Negative for chest pain.  Gastrointestinal: Positive for abdominal pain, nausea and vomiting. Negative for blood in stool, constipation and diarrhea.  Genitourinary: Negative for difficulty urinating, dysuria, flank pain, frequency, pelvic pain, vaginal bleeding and vaginal discharge.  Musculoskeletal: Negative for myalgias.  Skin: Negative for rash.  Neurological: Negative for headaches.     Physical Exam Updated Vital Signs BP (!) 142/88 (BP Location: Left Arm)   Pulse 82   Temp 98.2 F (36.8 C) (Oral)   Resp 20   Wt 115 kg   SpO2 100%   BMI 43.52 kg/m   Physical Exam Vitals signs and nursing note reviewed.  Constitutional:      Appearance: She is obese. She is ill-appearing.     Comments: Obviously uncomfortable appearing female. Writhing around in bed holding onto stomach.   HENT:     Head: Normocephalic and atraumatic.  Eyes:     Conjunctiva/sclera: Conjunctivae normal.  Neck:     Musculoskeletal: Neck supple.  Cardiovascular:     Rate and Rhythm: Normal rate and regular rhythm.     Heart sounds: Normal heart sounds.  Pulmonary:     Effort: Pulmonary effort is normal.     Breath sounds: Normal breath sounds. No wheezing, rhonchi or rales.  Abdominal:     General: Abdomen is flat.     Palpations: Abdomen is soft.     Tenderness: There is generalized abdominal tenderness. There is guarding (voluntary). There is no right CVA tenderness, left CVA tenderness or rebound.  Skin:    General: Skin is warm and dry.  Neurological:     Mental Status: She is alert.      ED Treatments / Results  Labs (all labs ordered are listed, but only abnormal results are displayed) Labs Reviewed  COMPREHENSIVE METABOLIC PANEL - Abnormal; Notable for the following components:      Result Value    Glucose, Bld 133 (*)    AST 153 (*)    ALT 97 (*)    All other components within normal limits  CBC WITH DIFFERENTIAL/PLATELET - Abnormal; Notable for the following components:   WBC 12.7 (*)    Neutro Abs 10.9 (*)    All other components within normal limits  URINALYSIS, ROUTINE W REFLEX MICROSCOPIC - Abnormal; Notable for the following components:   Specific Gravity, Urine >1.046 (*)    All other components within normal limits  LIPASE, BLOOD  I-STAT BETA HCG BLOOD, ED ([facility], [facility], [facility] ONLY)    EKG None  Radiology  Ct Abdomen Pelvis W Contrast  Result Date: 01/04/2019 CLINICAL DATA:  Acute generalized abdominal pain since 3 a.m. with nausea and vomiting. EXAM: CT ABDOMEN AND PELVIS WITH CONTRAST TECHNIQUE: Multidetector CT imaging of the abdomen and pelvis was performed using the standard protocol following bolus administration of intravenous contrast. CONTRAST:  157mL OMNIPAQUE IOHEXOL 300 MG/ML  SOLN COMPARISON:  None. FINDINGS: Lower chest: Streaky areas of bibasilar atelectasis but no infiltrates or effusions. No worrisome pulmonary lesions. The heart is normal in size. No pericardial effusion. Hepatobiliary: There is mild intrahepatic biliary dilatation without obvious cause. The common bile duct is normal in caliber and has a normal course. Maximum diameter is 5.5 mm. No focal hepatic lesions are identified. Mild gallbladder distension measuring slightly greater than 5 cm in the transverse dimension. I do not see any obvious gallstones, wall thickening or pericholecystic fluid but recommend correlation with any right upper quadrant abdominal pain or Murphy sign. Right upper quadrant ultrasound examination may be helpful for further evaluation if indicated. Pancreas: No mass, inflammation or ductal dilatation. Spleen: Normal size.  No focal lesions. Adrenals/Urinary Tract: The adrenal glands and kidneys are unremarkable. The bladder appears normal. Stomach/Bowel: The stomach, duodenum, small  bowel and colon are grossly normal without oral contrast. No acute inflammatory changes, mass lesions or obstructive findings. The terminal ileum and appendix are normal. Vascular/Lymphatic: The aorta is normal in caliber. No dissection. The branch vessels are patent. The major venous structures are patent. No mesenteric or retroperitoneal mass or adenopathy. Small scattered lymph nodes are noted. Reproductive: The uterus demonstrates multiple fibroids. An IUD is noted in the endometrial canal. The ovaries appear normal. There is a simple appearing cyst or follicle associated with the right ovary. Other: No pelvic mass or pelvic adenopathy. No free pelvic fluid collections. No inguinal mass or adenopathy. Musculoskeletal: No significant bony findings. IMPRESSION: Mild intrahepatic biliary dilatation of uncertain etiology. The common bile duct appears to be normal in caliber. There is also gallbladder distension without definite wall thickening or pericholecystic inflammatory changes. Recommend correlation with right upper quadrant abdominal pain and liver function studies. Right upper quadrant ultrasound examination may be helpful for further evaluation. No other significant abdominal/pelvic findings. Electronically Signed   By: Marijo Sanes M.D.   On: 01/04/2019 12:06   US Abdomen Limited Ruq  Result Date: 01/04/2019 CLINICAL DATA:  Right upper quadrant pain with nausea and vomiting for 1 day. EXAM: ULTRASOUND ABDOMEN LIMITED RIGHT UPPER QUADRANT COMPARISON:  CT abdomen and pelvis today. FINDINGS: Gallbladder: A small cluster of gravel type stones is seen within the fundus of the gallbladder. There is no pericholecystic fluid or wall thickening. Sonographer reports negative Murphy's sign. Common bile duct: Diameter: 0.4 cm. Liver: No focal lesion. Echogenicity is normal. Mild intrahepatic ductal dilatation is noted. Portal vein is patent on color Doppler imaging with normal direction of blood flow towards the  liver. Other: None. IMPRESSION: Small cluster of small stones in the gallbladder without evidence of cholecystitis. Mild intrahepatic biliary ductal dilatation without cause identified. The common bile duct is normal in diameter. Electronically Signed   By: Inge Rise M.D.   On: 01/04/2019 13:37    Procedures Procedures (including critical care time)  Medications Ordered in ED Medications  sodium chloride (PF) 0.9 % injection (has no administration in time range)  morphine 4 MG/ML injection 4 mg (4 mg Intravenous Given 01/04/19 1049)  ondansetron (ZOFRAN) injection 4 mg (4 mg Intravenous Given 01/04/19 1047)  sodium chloride 0.9 % bolus  1,000 mL (0 mLs Intravenous Stopped 01/04/19 1347)  piperacillin-tazobactam (ZOSYN) IVPB 3.375 g (0 g Intravenous Stopped 01/04/19 1304)  iohexol (OMNIPAQUE) 300 MG/ML solution 100 mL (100 mLs Intravenous Contrast Given 01/04/19 1144)  morphine 4 MG/ML injection 4 mg (4 mg Intravenous Given 01/04/19 1242)     Initial Impression / Assessment and Plan / ED Course  I have reviewed the triage vital signs and the nursing notes.  Pertinent labs & imaging results that were available during my care of the patient were reviewed by me and considered in my medical decision making (see chart for details).    33 year old female who presents to the ED today with diffuse abdominal pain, nausea, vomiting that began earlier this morning and woke her up out of her sleep.  Patient is obviously uncomfortable in appearance.  She is writhing around the bed clutching her stomach.  Is unable to give much history due to diffuse pain.  SHe has voluntary guarding on exam and is diffusely tender throughout her abdomen.  Previous past surgical histories to abdomen includes cesarean and D&C.  She denies chance of pregnancy at this time.  She reports she has a Mirena IUD in place that was placed in May of this year.  Not have periods with this.  No vaginal complaints today.  Patient  is not concerned about STDs at this time.  Will provide liter normal saline bolus, morphine for pain, for nausea.  Baseline blood work also obtained.  CT abdomen pelvis with such diffuse abdominal pain and voluntary guarding.   Leukocytosis at 12,700.  Given this and diffuse abdominal pain will cover with Zosyn with concern for intra-abdominal infection.   Patient received morphine I was able to fully reevaluate her.  She is mostly tender in the upper quadrants as well as epigastrium today.  Some minor tenderness throughout the lower abdomen as well, it appears worse in the upper quadrants.  CT scan already done - shows some biliary dilatation as well as bladder distention.  Radiologist recommended further evaluation with LFTs.  It does appear that her AST and Alt are elevated today.  Obtain right upper quadrant ultrasound at this time to rule out cholecystitis.   Ultrasound with findings of gallstones.  The common bile duct normal diameter.  Leukocytosis is only mildly elevated due to pain.  She was fluid challenge in the ED and able to tolerate fluids without vomiting.  I have advised that she follow-up with [place] [facility] surgery to schedule elective cholecystectomy.  I have prescribed Zofran and a short course of narcotic pain medication for her to take as needed for pain.  She is advised to eat a bland diet.  Return precautions have been discussed with patient.  She is in agreement with plan at this time and stable for discharge home.  This note was prepared using Dragon voice recognition software and may include unintentional dictation errors due to the inherent limitations of voice recognition software.       Final Clinical Impressions(s) / ED Diagnoses   Final diagnoses:  RUQ abdominal pain  Calculus of gallbladder without cholecystitis without obstruction    ED Discharge Orders         Ordered    oxyCODONE-acetaminophen (PERCOCET/ROXICET) 5-325 MG tablet  Every 6 hours PRN      01/04/19 1422    ondansetron (ZOFRAN) 4 MG tablet  Every 6 hours     01/04/19 1422           Halsey, [place],  PA-C 01/04/19 [place], DO 01/04/19 1506

## 2019-01-04 NOTE — ED Notes (Addendum)
Provided a warm blanket and dimmed lights. Patient is aware of urine sample, but will hold for a few minutes until she stops actively vomiting.

## 2019-01-04 NOTE — Discharge Instructions (Signed)
Your ultrasound and CT scan showed stones in your gallbladder which is causing your pain.  Please follow up with [facility] to schedule an appointment to discuss removal of your gallbladder.  I have prescribed a short course of pain medication as well as nausea medication. I would recommend taking Ibuprofen and if you have breakthrough pain take the narcotic pain medication then.

## 2019-01-04 NOTE — ED Triage Notes (Signed)
Pt c/o generalized abd pain since 0300. Pt states she has had 2 episodes of emesis since then. Pt endorses nausea. Pt in obvious pain. Pt denies diarrhea, cough, SHOB, fever.

## 2019-01-04 NOTE — ED Notes (Signed)
Pt given water and crackers for PO challenge

## 2019-01-11 ENCOUNTER — Other Ambulatory Visit: Payer: Self-pay | Admitting: General Surgery

## 2019-01-17 ENCOUNTER — Other Ambulatory Visit: Payer: Self-pay | Admitting: General Surgery

## 2019-01-19 ENCOUNTER — Other Ambulatory Visit: Payer: Self-pay | Admitting: General Surgery

## 2019-01-19 NOTE — Pre-Procedure Instructions (Signed)
Dana Garcia [id number], [place] [address] Phone: [phone] Fax: [phone]      Your procedure is scheduled on Tuesday November 17th.  Report to [facility] Main Entrance "A" at 10:00 A.M., and check in at the Admitting office.  Call this number if you have problems the morning of surgery:  [phone]  Call [phone] if you have any questions prior to your surgery date Monday-Friday 8am-4pm    Remember:  Do not eat or drink after midnight the night before your surgery  You may drink clear liquids until 9:00 the morning of your surgery.   Clear liquids allowed are: Water, Non-Citrus Juices (without pulp), Carbonated Beverages, Clear Tea, Black Coffee Only, and Gatorade    Take these medicines the morning of surgery with A SIP OF WATER  ondansetron (ZOFRAN) if needed oxyCODONE-acetaminophen (PERCOCET/ROXICET) if needed   7 days prior to surgery STOP taking any Aspirin (unless otherwise instructed by your surgeon), Aleve, Naproxen, Ibuprofen, Motrin, Advil, Goody's, BC's, all herbal medications, fish oil, and all vitamins.    The Morning of Surgery  Do not wear jewelry, make-up or nail polish.  Do not wear lotions, powders, or perfumes/colognes, or deodorant  Do not shave 48 hours prior to surgery.  Men may shave face and neck.  Do not bring valuables to the hospital.  [facility] is not responsible for any belongings or valuables.  If you are a smoker, DO NOT Smoke 24 hours prior to surgery IF you wear a CPAP at night please bring your mask, tubing, and machine the morning of surgery   Remember that you must have someone to transport you home after your surgery, and remain with you for 24 hours if you are discharged the same day.   Contacts, glasses, hearing aids, dentures or bridgework may not be worn into surgery.    Leave your suitcase in the car.   After surgery it may be brought to your room.  For patients admitted to the hospital, discharge time will be determined by your treatment team.  Patients discharged the day of surgery will not be allowed to drive home.    Special instructions:   [facility]- Preparing For Surgery  Before surgery, you can play an important role. Because skin is not sterile, your skin needs to be as free of germs as possible. You can reduce the number of germs on your skin by washing with CHG (chlorahexidine gluconate) Soap before surgery.  CHG is an antiseptic cleaner which kills germs and bonds with the skin to continue killing germs even after washing.    Oral Hygiene is also important to reduce your risk of infection.  Remember - BRUSH YOUR TEETH THE MORNING OF SURGERY WITH YOUR REGULAR TOOTHPASTE  Please do not use if you have an allergy to CHG or antibacterial soaps. If your skin becomes reddened/irritated stop using the CHG.  Do not shave (including legs and underarms) for at least 48 hours prior to first CHG shower. It is OK to shave your face.  Please follow these instructions carefully.   1. Shower the NIGHT BEFORE SURGERY and the MORNING OF SURGERY with CHG Soap.   2. If you chose to wash your hair, wash your hair first as usual with your normal shampoo.  3. After you shampoo, rinse your hair and body thoroughly to remove the shampoo.  4.  Use CHG as you would any other liquid soap. You can apply CHG directly to the skin and wash gently with a scrungie or a clean washcloth.   5. Apply the CHG Soap to your body ONLY FROM THE NECK DOWN.  Do not use on open wounds or open sores. Avoid contact with your eyes, ears, mouth and genitals (private parts). Wash Face and genitals (private parts)  with your normal soap.   6. Wash thoroughly, paying special attention to the area where your surgery will be performed.  7. Thoroughly rinse your body with warm water from the neck down.  8. DO NOT shower/wash  with your normal soap after using and rinsing off the CHG Soap.  9. Pat yourself dry with a CLEAN TOWEL.  10. Wear CLEAN PAJAMAS to bed the night before surgery, wear comfortable clothes the morning of surgery  11. Place CLEAN SHEETS on your bed the night of your first shower and DO NOT SLEEP WITH PETS.    Day of Surgery:  Do not apply any deodorants/lotions. Please shower the morning of surgery with the CHG soap  Please wear clean clothes to the hospital/surgery center.   Remember to brush your teeth WITH YOUR REGULAR TOOTHPASTE.   Please read over the following fact sheets that you were given.

## 2019-01-20 ENCOUNTER — Other Ambulatory Visit: Payer: Self-pay

## 2019-01-20 ENCOUNTER — Other Ambulatory Visit (HOSPITAL_COMMUNITY)
Admission: RE | Admit: 2019-01-20 | Discharge: 2019-01-20 | Disposition: A | Discharge status: Home / Self Care | Payer: BC Managed Care – PPO | Source: Ambulatory Visit | Attending: General Surgery | Admitting: General Surgery

## 2019-01-20 ENCOUNTER — Encounter (HOSPITAL_COMMUNITY): Payer: Self-pay

## 2019-01-20 ENCOUNTER — Encounter (HOSPITAL_COMMUNITY)
Admission: RE | Admit: 2019-01-20 | Discharge: 2019-01-20 | Disposition: A | Discharge status: Home / Self Care | Payer: BC Managed Care – PPO | Source: Ambulatory Visit | Attending: General Surgery | Admitting: General Surgery

## 2019-01-20 DIAGNOSIS — Z20828 Contact with and (suspected) exposure to other viral communicable diseases: Secondary | ICD-10-CM | POA: Diagnosis not present

## 2019-01-20 DIAGNOSIS — Z01812 Encounter for preprocedural laboratory examination: Secondary | ICD-10-CM | POA: Insufficient documentation

## 2019-01-20 LAB — COMPREHENSIVE METABOLIC PANEL
ALT: 20 U/L (ref 0–44)
AST: 17 U/L (ref 15–41)
Albumin: 3.8 g/dL (ref 3.5–5.0)
Alkaline Phosphatase: 47 U/L (ref 38–126)
Anion gap: 9 (ref 5–15)
BUN: 8 mg/dL (ref 6–20)
CO2: 28 mmol/L (ref 22–32)
Calcium: 9.5 mg/dL (ref 8.9–10.3)
Chloride: 104 mmol/L (ref 98–111)
Creatinine, Ser: 0.91 mg/dL (ref 0.44–1.00)
GFR calc Af Amer: >60 mL/min (ref >60)
GFR calc non Af Amer: >60 mL/min (ref >60)
Glucose, Bld: 94 mg/dL (ref 70–99)
Potassium: 3.9 mmol/L (ref 3.5–5.1)
Sodium: 141 mmol/L (ref 135–145)
Total Bilirubin: 0.5 mg/dL (ref 0.3–1.2)
Total Protein: 7.2 g/dL (ref 6.5–8.1)

## 2019-01-20 LAB — CBC WITH DIFFERENTIAL/PLATELET
Abs Immature Granulocytes: 0.01 10*3/uL (ref 0.00–0.07)
Basophils Absolute: 0 10*3/uL (ref 0.0–0.1)
Basophils Relative: 1 %
Eosinophils Absolute: 0 10*3/uL (ref 0.0–0.5)
Eosinophils Relative: 1 %
HCT: 40.9 % (ref 36.0–46.0)
Hemoglobin: 13.5 g/dL (ref 12.0–15.0)
Immature Granulocytes: 0 %
Lymphocytes Relative: 45 %
Lymphs Abs: 2.7 10*3/uL (ref 0.7–4.0)
MCH: 28.8 pg (ref 26.0–34.0)
MCHC: 33 g/dL (ref 30.0–36.0)
MCV: 87.4 fL (ref 80.0–100.0)
Monocytes Absolute: 0.4 10*3/uL (ref 0.1–1.0)
Monocytes Relative: 7 %
Neutro Abs: 2.8 10*3/uL (ref 1.7–7.7)
Neutrophils Relative %: 46 %
Platelets: 218 10*3/uL (ref 150–400)
RBC: 4.68 MIL/uL (ref 3.87–5.11)
RDW: 13 % (ref 11.5–15.5)
WBC: 5.9 10*3/uL (ref 4.0–10.5)
nRBC: 0 % (ref 0.0–0.2)

## 2019-01-20 NOTE — Progress Notes (Signed)
PCP - Dr. Gorden Harms Cardiologist - patient denies  PPM/ICD - n/a Device Orders -  Rep Notified -   Chest x-ray - n/a EKG - n/a Stress Test - patient denies ECHO - patient denies Cardiac Cath - patient denies  Sleep Study - patient denies CPAP -   Fasting Blood Sugar - n/a Checks Blood Sugar _____ times a day  Blood Thinner Instructions: n/a Aspirin Instructions:  ERAS Protcol - yes, no drink PRE-SURGERY Ensure or G2-   COVID TEST- 01/20/2019   Anesthesia review: n/a  Patient denies shortness of breath, fever, cough and chest pain at PAT appointment   All instructions explained to the patient, with a verbal understanding of the material. Patient agrees to go over the instructions while at home for a better understanding. Patient also instructed to self quarantine after being tested for COVID-19. The opportunity to ask questions was provided.

## 2019-01-21 NOTE — H&P (Signed)
Dana Garcia Location: [facility] Surgery Patient #: [id number] DOB: November 18, 1985 Single / Language: Cleophus Molt / Race: Black or African American Female      History of Present Illness     . This is a pleasant 33 year old female, referred by Dr. Ronnald Nian in the ED for symptomatic gallstones. [name], PA provides primary care. 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 This is intermittent. She's not sure is related to meals. No diarrhea. She had a more severe attack on October 28 and went to the emergency room after eating chicken wings. Lab work showed AST 153, ALT 97, WBC 12,700. Lipase normal. CT scan showed gallstones and was otherwise normal. Ultrasound showed small stones and CBD normal. Question of some dilatation of the intrahepatic ducts. She is feeling fine today she is interested in cholecystectomy      Past history significant for IUD in place. Partial thyroidectomy for benign disease. 2 children. One C-section. Mother and father living without major medical problems social history reveals she is single. Has 2 children. Lives in [place]. [place] Denies tobacco. Drinks alcohol rarely. 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      It is highly likely that she is having accelerating attacks of biliary colic. Elective cholecystectomy is recommended. She wants to go ahead with this in November I discussed the indications, details, techniques and risk of the surgery with her. She is aware the risk of bleeding, infection, conversion to open laparotomy, port site hernia, pancreatitis, diarrhea, injury to adjacent organs with major reconstructive surgery. She understands all of these issues. All of her questions were answered. She agrees with this plan.    Allergies  metroNIDAZOLE *CHEMICALS*  Sulfamethazine *CHEMICALS*  Allergies Reconciled   Medication History   No Current Medications Medications Reconciled  Vitals  Weight: 237.8 lb Height: 64in Body Surface Area: 2.11 m Body Mass Index: 40.82 kg/m  Temp.: 97.50F  Pulse: 98 (Regular)  BP: 110/80 (Sitting, Left Arm, Standard)     Physical Exam  General Mental Status-Alert. General Appearance-Consistent with stated age. Hydration-Well hydrated. Voice-Normal. Note: BMI 40   Integumentary Note: Some tattoos of chest and legs.   Head and Neck Head-normocephalic, atraumatic with no lesions or palpable masses. Trachea-midline. Thyroid Gland Characteristics - normal size and consistency.  Eye Eyeball - Bilateral-Extraocular movements intact. Sclera/Conjunctiva - Bilateral-No scleral icterus.  Chest and Lung Exam Chest and lung exam reveals -quiet, even and easy respiratory effort with no use of accessory muscles and on auscultation, normal breath sounds, no adventitious sounds and normal vocal resonance. Inspection Chest Wall - Normal. Back - normal.  Cardiovascular Cardiovascular examination reveals -normal heart sounds, regular rate and rhythm with no murmurs and normal pedal pulses bilaterally.  Abdomen Inspection Inspection of the abdomen reveals - No Hernias. Skin - Scar - no surgical scars. Palpation/Percussion Palpation and Percussion of the abdomen reveal - Soft, Non Tender, No Rebound tenderness, No Rigidity (guarding) and No hepatosplenomegaly. Auscultation Auscultation of the abdomen reveals - Bowel sounds normal. Note: Scars at upper abdominal umbilicus from previous jewelry. Her abdomen is nontender. Nondistended. No mass. No organomegaly. Benign exam today   Neurologic Neurologic evaluation reveals -alert and oriented x 3 with no impairment of recent or remote memory. Mental Status-Normal.  Musculoskeletal Normal Exam - Left-Upper Extremity Strength Normal and Lower Extremity Strength Normal. Normal Exam -  Right-Upper Extremity Strength Normal and  Lower Extremity Strength Normal.  Lymphatic Head & Neck  General Head & Neck Lymphatics: Bilateral - Description - Normal. Axillary  General Axillary Region: Bilateral - Description - Normal. Tenderness - Non Tender. Femoral & Inguinal  Generalized Femoral & Inguinal Lymphatics: Bilateral - Description - Normal. Tenderness - Non Tender.    Assessment & Plan GALLSTONES (K80.20)  you have been having episodes of upper abdominal pain associated with nausea and vomiting. you said this has been going on for a while The most recent attack was bad enough to go to the emergency department on October 28 after eating chicken wings. CT scan showed gallstones but otherwise no abdominal problems Ultrasound confirmed gallstones but there was no evidence of infection or blockage. Your liver function tests were slightly elevated your White blood cell count was slightly elevated There was no evidence of pancreatitis  Almost certainly you are having accelerating attacks of biliary colic You're at risk of developing a complication My advice is to have a gallbladder operation before you have a complication, and you agree  you will be scheduled for laparoscopic cholecystectomy with cholangiogram We reviewed the indications, techniques, and risks of the surgery in detail you state that your cousin can stay with you for 24 hours postop Please read over the patient information booklet that I reviewed with you  . BMI 40.0-44.9, ADULT (Z68.41) HISTORY OF THYROIDECTOMY (Z90.09) Impression: Partial. Benign disease. HISTORY OF C-SECTION [id number])    Edsel Petrin. Dalbert Batman, M.D., [facility] General and Minimally invasive Surgery Breast and Colorectal Surgery Office:   [phone] Pager:   [phone]

## 2019-01-22 LAB — NOVEL CORONAVIRUS, NAA (HOSP ORDER, SEND-OUT TO REF LAB; TAT 18-24 HRS): SARS-CoV-2, NAA: NOT DETECTED

## 2019-01-24 ENCOUNTER — Encounter (HOSPITAL_COMMUNITY)
Admission: RE | Disposition: A | Discharge status: Home / Self Care | Payer: Self-pay | Source: Home / Self Care | Attending: General Surgery

## 2019-01-24 ENCOUNTER — Ambulatory Visit (HOSPITAL_COMMUNITY): Payer: BC Managed Care – PPO

## 2019-01-24 ENCOUNTER — Encounter (HOSPITAL_COMMUNITY): Payer: Self-pay | Admitting: *Deleted

## 2019-01-24 ENCOUNTER — Observation Stay (HOSPITAL_COMMUNITY)
Admission: RE | Admit: 2019-01-24 | Discharge: 2019-01-25 | Disposition: A | Discharge status: Home / Self Care | Payer: BC Managed Care – PPO | Attending: General Surgery | Admitting: General Surgery

## 2019-01-24 ENCOUNTER — Other Ambulatory Visit: Payer: Self-pay

## 2019-01-24 DIAGNOSIS — Z882 Allergy status to sulfonamides status: Secondary | ICD-10-CM | POA: Diagnosis not present

## 2019-01-24 DIAGNOSIS — Z6841 Body Mass Index (BMI) 40.0 and over, adult: Secondary | ICD-10-CM | POA: Diagnosis not present

## 2019-01-24 DIAGNOSIS — K8010 Calculus of gallbladder with chronic cholecystitis without obstruction: Secondary | ICD-10-CM | POA: Diagnosis not present

## 2019-01-24 DIAGNOSIS — Z881 Allergy status to other antibiotic agents status: Secondary | ICD-10-CM | POA: Diagnosis not present

## 2019-01-24 DIAGNOSIS — Z975 Presence of (intrauterine) contraceptive device: Secondary | ICD-10-CM | POA: Diagnosis not present

## 2019-01-24 DIAGNOSIS — Z791 Long term (current) use of non-steroidal anti-inflammatories (NSAID): Secondary | ICD-10-CM | POA: Insufficient documentation

## 2019-01-24 DIAGNOSIS — Z23 Encounter for immunization: Secondary | ICD-10-CM | POA: Insufficient documentation

## 2019-01-24 DIAGNOSIS — Z79899 Other long term (current) drug therapy: Secondary | ICD-10-CM | POA: Insufficient documentation

## 2019-01-24 DIAGNOSIS — Z888 Allergy status to other drugs, medicaments and biological substances status: Secondary | ICD-10-CM | POA: Diagnosis not present

## 2019-01-24 DIAGNOSIS — E890 Postprocedural hypothyroidism: Secondary | ICD-10-CM | POA: Insufficient documentation

## 2019-01-24 DIAGNOSIS — K828 Other specified diseases of gallbladder: Secondary | ICD-10-CM | POA: Diagnosis not present

## 2019-01-24 DIAGNOSIS — Z419 Encounter for procedure for purposes other than remedying health state, unspecified: Secondary | ICD-10-CM

## 2019-01-24 HISTORY — PX: CHOLECYSTECTOMY: SHX55

## 2019-01-24 HISTORY — DX: Calculus of gallbladder with chronic cholecystitis without obstruction: K80.10

## 2019-01-24 HISTORY — DX: Calculus of gallbladder without cholecystitis without obstruction: K80.20

## 2019-01-24 LAB — CBC
HCT: 41.4 % (ref 36.0–46.0)
Hemoglobin: 13.8 g/dL (ref 12.0–15.0)
MCH: 29 pg (ref 26.0–34.0)
MCHC: 33.3 g/dL (ref 30.0–36.0)
MCV: 87 fL (ref 80.0–100.0)
Platelets: 207 10*3/uL (ref 150–400)
RBC: 4.76 MIL/uL (ref 3.87–5.11)
RDW: 12.8 % (ref 11.5–15.5)
WBC: 10.2 10*3/uL (ref 4.0–10.5)
nRBC: 0 % (ref 0.0–0.2)

## 2019-01-24 LAB — CREATININE, SERUM
Creatinine, Ser: 0.9 mg/dL (ref 0.44–1.00)
GFR calc Af Amer: >60 mL/min (ref >60)
GFR calc non Af Amer: >60 mL/min (ref >60)

## 2019-01-24 LAB — POCT PREGNANCY, URINE: Preg Test, Ur: NEGATIVE

## 2019-01-24 SURGERY — LAPAROSCOPIC CHOLECYSTECTOMY WITH INTRAOPERATIVE CHOLANGIOGRAM
Anesthesia: General | Site: Abdomen

## 2019-01-24 MED ORDER — ROCURONIUM BROMIDE 10 MG/ML (PF) SYRINGE
PREFILLED_SYRINGE | INTRAVENOUS | Status: AC
  Filled 2019-01-24: qty 20

## 2019-01-24 MED ORDER — ONDANSETRON 4 MG PO TBDP: 4.0000 mg | ORAL_TABLET | Freq: Four times a day (QID) | ORAL | Status: DC | PRN

## 2019-01-24 MED ORDER — SUCCINYLCHOLINE CHLORIDE 200 MG/10ML IV SOSY
PREFILLED_SYRINGE | INTRAVENOUS | Status: AC
  Filled 2019-01-24: qty 20

## 2019-01-24 MED ORDER — BUPIVACAINE HCL 0.25 % IJ SOLN
INTRAMUSCULAR | Status: DC | PRN
  Administered 2019-01-24: 12 mL

## 2019-01-24 MED ORDER — CEFAZOLIN SODIUM-DEXTROSE 2-4 GM/100ML-% IV SOLN
2.0000 g | Freq: Three times a day (TID) | INTRAVENOUS | Status: AC
  Administered 2019-01-24: 2 g via INTRAVENOUS
  Filled 2019-01-24: qty 100

## 2019-01-24 MED ORDER — ENOXAPARIN SODIUM 40 MG/0.4ML ~~LOC~~ SOLN: 40.0000 mg | SUBCUTANEOUS | Status: DC

## 2019-01-24 MED ORDER — GABAPENTIN 300 MG PO CAPS
ORAL_CAPSULE | ORAL | Status: AC
  Administered 2019-01-24: 300 mg via ORAL
  Filled 2019-01-24: qty 1

## 2019-01-24 MED ORDER — FENTANYL CITRATE (PF) 100 MCG/2ML IJ SOLN
INTRAMUSCULAR | Status: AC
  Filled 2019-01-24: qty 2

## 2019-01-24 MED ORDER — ROCURONIUM BROMIDE 10 MG/ML (PF) SYRINGE
PREFILLED_SYRINGE | INTRAVENOUS | Status: AC
  Filled 2019-01-24: qty 10

## 2019-01-24 MED ORDER — CEFAZOLIN SODIUM-DEXTROSE 2-4 GM/100ML-% IV SOLN
2.0000 g | INTRAVENOUS | Status: AC
  Administered 2019-01-24: 2 g via INTRAVENOUS
  Filled 2019-01-24: qty 100

## 2019-01-24 MED ORDER — CHLORHEXIDINE GLUCONATE CLOTH 2 % EX PADS: 6.0000 | MEDICATED_PAD | Freq: Once | CUTANEOUS | Status: DC

## 2019-01-24 MED ORDER — PANTOPRAZOLE SODIUM 40 MG IV SOLR
40.0000 mg | Freq: Every day | INTRAVENOUS | Status: DC
  Administered 2019-01-24: 40 mg via INTRAVENOUS
  Filled 2019-01-24: qty 40

## 2019-01-24 MED ORDER — LIDOCAINE 2% (20 MG/ML) 5 ML SYRINGE
INTRAMUSCULAR | Status: DC | PRN
  Administered 2019-01-24: 100 mg via INTRAVENOUS

## 2019-01-24 MED ORDER — GABAPENTIN 300 MG PO CAPS
300.0000 mg | ORAL_CAPSULE | ORAL | Status: AC
  Administered 2019-01-24: 11:00:00 300 mg via ORAL

## 2019-01-24 MED ORDER — ONDANSETRON HCL 4 MG/2ML IJ SOLN: 4.0000 mg | Freq: Four times a day (QID) | INTRAMUSCULAR | Status: DC | PRN

## 2019-01-24 MED ORDER — MIDAZOLAM HCL 2 MG/2ML IJ SOLN
INTRAMUSCULAR | Status: AC
  Filled 2019-01-24: qty 2

## 2019-01-24 MED ORDER — OXYCODONE HCL 5 MG PO TABS: 5.0000 mg | ORAL_TABLET | Freq: Once | ORAL | Status: DC | PRN

## 2019-01-24 MED ORDER — PROPOFOL 10 MG/ML IV BOLUS
INTRAVENOUS | Status: AC
  Filled 2019-01-24: qty 20

## 2019-01-24 MED ORDER — ONDANSETRON HCL 4 MG/2ML IJ SOLN
4.0000 mg | Freq: Once | INTRAMUSCULAR | Status: AC | PRN
  Administered 2019-01-24: 4 mg via INTRAVENOUS

## 2019-01-24 MED ORDER — HYDROCODONE-ACETAMINOPHEN 5-325 MG PO TABS: 1.0000 | ORAL_TABLET | ORAL | Status: DC | PRN

## 2019-01-24 MED ORDER — 0.9 % SODIUM CHLORIDE (POUR BTL) OPTIME
TOPICAL | Status: DC | PRN
  Administered 2019-01-24: 1000 mL

## 2019-01-24 MED ORDER — FENTANYL CITRATE (PF) 100 MCG/2ML IJ SOLN
INTRAMUSCULAR | Status: DC | PRN
  Administered 2019-01-24 (×3): 50 ug via INTRAVENOUS

## 2019-01-24 MED ORDER — CELECOXIB 200 MG PO CAPS
ORAL_CAPSULE | ORAL | Status: AC
  Administered 2019-01-24: 200 mg via ORAL
  Filled 2019-01-24: qty 1

## 2019-01-24 MED ORDER — CELECOXIB 200 MG PO CAPS
200.0000 mg | ORAL_CAPSULE | ORAL | Status: AC
  Administered 2019-01-24: 11:00:00 200 mg via ORAL

## 2019-01-24 MED ORDER — SODIUM CHLORIDE 0.9 % IR SOLN
Status: DC | PRN
  Administered 2019-01-24 (×2): 1000 mL

## 2019-01-24 MED ORDER — STERILE WATER FOR IRRIGATION IR SOLN
Status: DC | PRN
  Administered 2019-01-24: 1000 mL

## 2019-01-24 MED ORDER — SUGAMMADEX SODIUM 200 MG/2ML IV SOLN
INTRAVENOUS | Status: DC | PRN
  Administered 2019-01-24: 225 mg via INTRAVENOUS

## 2019-01-24 MED ORDER — PROPOFOL 10 MG/ML IV BOLUS
INTRAVENOUS | Status: DC | PRN
  Administered 2019-01-24: 200 mg via INTRAVENOUS

## 2019-01-24 MED ORDER — HYDROMORPHONE HCL 1 MG/ML IJ SOLN: 1.0000 mg | INTRAMUSCULAR | Status: DC | PRN

## 2019-01-24 MED ORDER — LACTATED RINGERS IV SOLN
INTRAVENOUS | Status: DC
  Administered 2019-01-24: 1000 mL via INTRAVENOUS
  Administered 2019-01-24: 13:00:00 via INTRAVENOUS

## 2019-01-24 MED ORDER — OXYCODONE HCL 5 MG/5ML PO SOLN: 5.0000 mg | Freq: Once | ORAL | Status: DC | PRN

## 2019-01-24 MED ORDER — KETOROLAC TROMETHAMINE 15 MG/ML IJ SOLN
INTRAMUSCULAR | Status: DC | PRN
  Administered 2019-01-24: 15 mg via INTRAVENOUS

## 2019-01-24 MED ORDER — PHENYLEPHRINE 40 MCG/ML (10ML) SYRINGE FOR IV PUSH (FOR BLOOD PRESSURE SUPPORT)
PREFILLED_SYRINGE | INTRAVENOUS | Status: AC
  Filled 2019-01-24: qty 30

## 2019-01-24 MED ORDER — SODIUM CHLORIDE 0.9 % IV SOLN
INTRAVENOUS | Status: DC | PRN
  Administered 2019-01-24: 13:00:00 21 mL

## 2019-01-24 MED ORDER — INFLUENZA VAC SPLIT QUAD 0.5 ML IM SUSY
0.5000 mL | PREFILLED_SYRINGE | INTRAMUSCULAR | Status: AC
  Administered 2019-01-25: 0.5 mL via INTRAMUSCULAR
  Filled 2019-01-24: qty 0.5

## 2019-01-24 MED ORDER — LIDOCAINE 2% (20 MG/ML) 5 ML SYRINGE
INTRAMUSCULAR | Status: AC
  Filled 2019-01-24: qty 20

## 2019-01-24 MED ORDER — SENNA 8.6 MG PO TABS
1.0000 | ORAL_TABLET | Freq: Two times a day (BID) | ORAL | Status: DC
  Administered 2019-01-24 – 2019-01-25 (×2): 8.6 mg via ORAL
  Filled 2019-01-24 (×2): qty 1

## 2019-01-24 MED ORDER — LIDOCAINE 2% (20 MG/ML) 5 ML SYRINGE
INTRAMUSCULAR | Status: AC
  Filled 2019-01-24: qty 5

## 2019-01-24 MED ORDER — FENTANYL CITRATE (PF) 250 MCG/5ML IJ SOLN
INTRAMUSCULAR | Status: AC
  Filled 2019-01-24: qty 5

## 2019-01-24 MED ORDER — DEXAMETHASONE SODIUM PHOSPHATE 10 MG/ML IJ SOLN
INTRAMUSCULAR | Status: DC | PRN
  Administered 2019-01-24: 4 mg via INTRAVENOUS

## 2019-01-24 MED ORDER — ACETAMINOPHEN 500 MG PO TABS
1000.0000 mg | ORAL_TABLET | ORAL | Status: AC
  Administered 2019-01-24: 11:00:00 1000 mg via ORAL

## 2019-01-24 MED ORDER — DEXAMETHASONE SODIUM PHOSPHATE 10 MG/ML IJ SOLN
INTRAMUSCULAR | Status: AC
  Filled 2019-01-24: qty 4

## 2019-01-24 MED ORDER — ROCURONIUM BROMIDE 10 MG/ML (PF) SYRINGE
PREFILLED_SYRINGE | INTRAVENOUS | Status: DC | PRN
  Administered 2019-01-24: 70 mg via INTRAVENOUS
  Administered 2019-01-24: 20 mg via INTRAVENOUS
  Administered 2019-01-24: 10 mg via INTRAVENOUS

## 2019-01-24 MED ORDER — EPHEDRINE 5 MG/ML INJ
INTRAVENOUS | Status: AC
  Filled 2019-01-24: qty 20

## 2019-01-24 MED ORDER — SUCCINYLCHOLINE CHLORIDE 200 MG/10ML IV SOSY
PREFILLED_SYRINGE | INTRAVENOUS | Status: AC
  Filled 2019-01-24: qty 10

## 2019-01-24 MED ORDER — MIDAZOLAM HCL 5 MG/5ML IJ SOLN
INTRAMUSCULAR | Status: DC | PRN
  Administered 2019-01-24: 2 mg via INTRAVENOUS

## 2019-01-24 MED ORDER — ACETAMINOPHEN 500 MG PO TABS
ORAL_TABLET | ORAL | Status: AC
  Administered 2019-01-24: 1000 mg via ORAL
  Filled 2019-01-24: qty 1

## 2019-01-24 MED ORDER — ONDANSETRON HCL 4 MG/2ML IJ SOLN
INTRAMUSCULAR | Status: AC
  Filled 2019-01-24: qty 8

## 2019-01-24 MED ORDER — FENTANYL CITRATE (PF) 100 MCG/2ML IJ SOLN
25.0000 ug | INTRAMUSCULAR | Status: DC | PRN
  Administered 2019-01-24 (×3): 50 ug via INTRAVENOUS

## 2019-01-24 MED ORDER — TRAMADOL HCL 50 MG PO TABS: 50.0000 mg | ORAL_TABLET | Freq: Four times a day (QID) | ORAL | Status: DC | PRN

## 2019-01-24 MED ORDER — LACTATED RINGERS IV SOLN
INTRAVENOUS | Status: DC
  Administered 2019-01-24: 19:00:00 via INTRAVENOUS

## 2019-01-24 MED ORDER — DICYCLOMINE HCL 20 MG PO TABS
20.0000 mg | ORAL_TABLET | Freq: Two times a day (BID) | ORAL | Status: DC | PRN
  Filled 2019-01-24: qty 1

## 2019-01-24 SURGICAL SUPPLY — 51 items
ADH SKN CLS APL DERMABOND .7 (GAUZE/BANDAGES/DRESSINGS) ×1
APL PRP STRL LF DISP 70% ISPRP (MISCELLANEOUS) ×1
APPLIER CLIP ROT 10 11.4 M/L (STAPLE) ×3
APR CLP MED LRG 11.4X10 (STAPLE) ×1
BAG SPEC RTRVL 10 TROC 200 (ENDOMECHANICALS)
BAG SPEC RTRVL LRG 6X4 10 (ENDOMECHANICALS) ×1
BIOPATCH RED 1 DISK 7.0 (GAUZE/BANDAGES/DRESSINGS) ×1 IMPLANT
BIOPATCH RED 1IN DISK 7.0MM (GAUZE/BANDAGES/DRESSINGS) ×1
BLADE CLIPPER SURG (BLADE) IMPLANT
CANISTER SUCT 3000ML PPV (MISCELLANEOUS) ×3 IMPLANT
CHLORAPREP W/TINT 26 (MISCELLANEOUS) ×3 IMPLANT
CLIP APPLIE ROT 10 11.4 M/L (STAPLE) ×1 IMPLANT
COVER MAYO STAND STRL (DRAPES) ×3 IMPLANT
COVER SURGICAL LIGHT HANDLE (MISCELLANEOUS) ×3 IMPLANT
COVER WAND RF STERILE (DRAPES) ×3 IMPLANT
DERMABOND ADVANCED (GAUZE/BANDAGES/DRESSINGS) ×2
DERMABOND ADVANCED .7 DNX12 (GAUZE/BANDAGES/DRESSINGS) ×1 IMPLANT
DRAIN CHANNEL 19F RND (DRAIN) ×2 IMPLANT
DRAPE C-ARM 42X120 X-RAY (DRAPES) ×3 IMPLANT
DRSG TEGADERM 4X4.75 (GAUZE/BANDAGES/DRESSINGS) ×2 IMPLANT
ELECT REM PT RETURN 9FT ADLT (ELECTROSURGICAL) ×3
ELECTRODE REM PT RTRN 9FT ADLT (ELECTROSURGICAL) ×1 IMPLANT
EVACUATOR SILICONE 100CC (DRAIN) ×2 IMPLANT
GLOVE SS BIOGEL STRL SZ 7 (GLOVE) ×1 IMPLANT
GLOVE SUPERSENSE BIOGEL SZ 7 (GLOVE) ×2
GOWN STRL REUS W/ TWL LRG LVL3 (GOWN DISPOSABLE) ×2 IMPLANT
GOWN STRL REUS W/ TWL XL LVL3 (GOWN DISPOSABLE) ×1 IMPLANT
GOWN STRL REUS W/TWL LRG LVL3 (GOWN DISPOSABLE) ×15
GOWN STRL REUS W/TWL XL LVL3 (GOWN DISPOSABLE) ×6
KIT BASIN OR (CUSTOM PROCEDURE TRAY) ×3 IMPLANT
KIT TURNOVER KIT B (KITS) ×3 IMPLANT
NS IRRIG 1000ML POUR BTL (IV SOLUTION) ×3 IMPLANT
PAD ARMBOARD 7.5X6 YLW CONV (MISCELLANEOUS) ×3 IMPLANT
POUCH RETRIEVAL ECOSAC 10 (ENDOMECHANICALS) IMPLANT
POUCH RETRIEVAL ECOSAC 10MM (ENDOMECHANICALS)
POUCH SPECIMEN RETRIEVAL 10MM (ENDOMECHANICALS) ×2 IMPLANT
SCISSORS LAP 5X35 DISP (ENDOMECHANICALS) ×3 IMPLANT
SET CHOLANGIOGRAPH 5 50 .035 (SET/KITS/TRAYS/PACK) ×3 IMPLANT
SET IRRIG TUBING LAPAROSCOPIC (IRRIGATION / IRRIGATOR) ×3 IMPLANT
SET TUBE SMOKE EVAC HIGH FLOW (TUBING) ×3 IMPLANT
SLEEVE ENDOPATH XCEL 5M (ENDOMECHANICALS) ×3 IMPLANT
SPECIMEN JAR SMALL (MISCELLANEOUS) ×3 IMPLANT
SUT ETHILON 2 0 FS 18 (SUTURE) ×2 IMPLANT
SUT MNCRL AB 4-0 PS2 18 (SUTURE) ×3 IMPLANT
TOWEL GREEN STERILE (TOWEL DISPOSABLE) ×3 IMPLANT
TOWEL GREEN STERILE FF (TOWEL DISPOSABLE) ×3 IMPLANT
TRAY LAPAROSCOPIC MC (CUSTOM PROCEDURE TRAY) ×3 IMPLANT
TROCAR XCEL BLUNT TIP 100MML (ENDOMECHANICALS) ×3 IMPLANT
TROCAR XCEL NON-BLD 11X100MML (ENDOMECHANICALS) ×3 IMPLANT
TROCAR XCEL NON-BLD 5MMX100MML (ENDOMECHANICALS) ×3 IMPLANT
WATER STERILE IRR 1000ML POUR (IV SOLUTION) ×3 IMPLANT

## 2019-01-24 NOTE — Transfer of Care (Signed)
Immediate Anesthesia Transfer of Care Note  Patient: Dana Garcia  Procedure(s) Performed: LAPAROSCOPIC CHOLECYSTECTOMY WITH ATTEMPTED INTRAOPERATIVE CHOLANGIOGRAM (N/A Abdomen)  Patient Location: PACU  Anesthesia Type:General  Level of Consciousness: awake, alert  and oriented  Airway & Oxygen Therapy: Patient Spontanous Breathing and Patient connected to nasal cannula oxygen  Post-op Assessment: Report given to RN and Post -op Vital signs reviewed and stable  Post vital signs: Reviewed and stable  Last Vitals:  Vitals Value Taken Time  BP 152/91 01/24/19 1406  Temp 36.5 C 01/24/19 1406  Pulse 91 01/24/19 1409  Resp 16 01/24/19 1409  SpO2 100 % 01/24/19 1409  Vitals shown include unvalidated device data.  Last Pain:  Vitals:   01/24/19 1406  TempSrc:   PainSc: Asleep      Patients Stated Pain Goal: 3 ([id number])  Complications: No apparent anesthesia complications and Patient re-intubated

## 2019-01-24 NOTE — Anesthesia Procedure Notes (Signed)
Procedure Name: Intubation Date/Time: 01/24/2019 12:05 PM Performed by: Marsa Aris, CRNA Pre-anesthesia Checklist: Patient identified, Emergency Drugs available, Suction available and Patient being monitored Patient Re-evaluated:Patient Re-evaluated prior to induction Oxygen Delivery Method: Circle System Utilized Preoxygenation: Pre-oxygenation with 100% oxygen Induction Type: IV induction Ventilation: Mask ventilation without difficulty Laryngoscope Size: Mac and 4 Grade View: Grade II Tube type: Oral Tube size: 7.0 mm Number of attempts: 1 Airway Equipment and Method: Stylet and Oral airway Placement Confirmation: ETT inserted through vocal cords under direct vision,  positive ETCO2 and breath sounds checked- equal and bilateral Secured at: 21 cm Tube secured with: Tape Dental Injury: Teeth and Oropharynx as per pre-operative assessment

## 2019-01-24 NOTE — Anesthesia Postprocedure Evaluation (Signed)
Anesthesia Post Note  Patient: Dana Garcia  Procedure(s) Performed: LAPAROSCOPIC CHOLECYSTECTOMY WITH ATTEMPTED INTRAOPERATIVE CHOLANGIOGRAM (N/A Abdomen)     Patient location during evaluation: PACU Anesthesia Type: General Level of consciousness: awake and alert Pain management: pain level controlled Vital Signs Assessment: post-procedure vital signs reviewed and stable Respiratory status: spontaneous breathing, nonlabored ventilation and respiratory function stable Cardiovascular status: blood pressure returned to baseline and stable Postop Assessment: no apparent nausea or vomiting Anesthetic complications: no    Last Vitals:  Vitals:   01/24/19 1040 01/24/19 1406  BP: 136/88 (!) 152/91  Pulse: 78 (!) 103  Resp: 18 12  Temp: 37 C 36.5 C  SpO2: 98% 92%    Last Pain:  Vitals:   01/24/19 1420  TempSrc:   PainSc: 6                  Lidia Collum

## 2019-01-24 NOTE — Interval H&P Note (Signed)
History and Physical Interval Note:  01/24/2019 11:34 AM  Dana Garcia  has presented today for surgery, with the diagnosis of GALLSTONES.  The various methods of treatment have been discussed with the patient and family. After consideration of risks, benefits and other options for treatment, the patient has consented to  Procedure(s): LAPAROSCOPIC CHOLECYSTECTOMY WITH INTRAOPERATIVE CHOLANGIOGRAM (N/A) as a surgical intervention.  The patient's history has been reviewed, patient examined, no change in status, stable for surgery.  I have reviewed the patient's chart and labs.  Questions were answered to the patient's satisfaction.     Adin Hector

## 2019-01-24 NOTE — Anesthesia Preprocedure Evaluation (Signed)
Anesthesia Evaluation  Patient identified by MRN, date of birth, ID band Patient awake    Reviewed: Allergy & Precautions, NPO status , Patient's Chart, lab work & pertinent test results  History of Anesthesia Complications Negative for: history of anesthetic complications  Airway Mallampati: II  TM Distance: >3 FB Neck ROM: Full    Dental  (+) Teeth Intact   Pulmonary neg pulmonary ROS,    Pulmonary exam normal        Cardiovascular negative cardio ROS Normal cardiovascular exam     Neuro/Psych negative neurological ROS  negative psych ROS   GI/Hepatic negative GI ROS, Neg liver ROS,   Endo/Other  Morbid obesity  Renal/GU negative Renal ROS  negative genitourinary   Musculoskeletal negative musculoskeletal ROS (+)   Abdominal   Peds  Hematology negative hematology ROS (+)   Anesthesia Other Findings   Reproductive/Obstetrics                            Anesthesia Physical Anesthesia Plan  ASA: III  Anesthesia Plan: General   Post-op Pain Management:    Induction: Intravenous  PONV Risk Score and Plan: 3 and Ondansetron, Dexamethasone, Treatment may vary due to age or medical condition and Midazolam  Airway Management Planned: Oral ETT  Additional Equipment: None  Intra-op Plan:   Post-operative Plan: Extubation in OR  Informed Consent: I have reviewed the patients History and Physical, chart, labs and discussed the procedure including the risks, benefits and alternatives for the proposed anesthesia with the patient or authorized representative who has indicated his/her understanding and acceptance.     Dental advisory given  Plan Discussed with:   Anesthesia Plan Comments:        Anesthesia Quick Evaluation

## 2019-01-24 NOTE — Op Note (Addendum)
Patient Name:           Dana Garcia   Date of Surgery:        01/24/2019  Pre op Diagnosis:      Chronic cholecystitis with cholelithiasis  Post op Diagnosis:    Same  Procedure:                 Laparoscopic cholecystectomy                                      Attempted cholangiogram                                      Placement of subhepatic drain  Surgeon:                     Edsel Petrin. Dalbert Batman, M.D., FACS  Assistant:                      OR staff  Operative Indications:   This is a pleasant 33 year old female, referred by Dr. Ronnald Nian in the ED for symptomatic gallstones. [name], PA provides primary care.       She's been having episodes of upper abdominal discomfort and nausea for some time. This is intermittent. She's not sure is related to meals. No diarrhea. She had a more severe attack on October 28 and went to the emergency room after eating chicken wings. Lab work showed AST 153, ALT 97, WBC 12,700. Lipase normal. CT scan showed gallstones and was otherwise normal. Ultrasound showed small stones and CBD normal. Question of some dilatation of the intrahepatic ducts. She was feeling fine when I saw her in the office recently and she is interested in cholecystectomy.      Preop liver function test have returned to normal.      Past history significant for IUD in place. Partial thyroidectomy for benign disease. 2 children. One C-section.      It is highly likely that she is having accelerating attacks of biliary colic. Elective cholecystectomy is recommended. She wants to go ahead with this . I discussed the indications, details, techniques and risk of the surgery with her. She is aware the risk of bleeding, infection, conversion to open laparotomy, port site hernia, pancreatitis, diarrhea, injury to adjacent organs with major reconstructive surgery. She understands all of these issues. All of her questions were answered. She agrees with this  plan.  Operative Findings:       The gallbladder was thin-walled and chronically inflamed.  I dissected out a very long, and very narrow cystic duct and obtained a very good critical view.  I attempted cholangiogram through 2 separate sites, and a little bit of contrast went into a nondistended common bile duct but mostly there was extravasation.  There appeared to be leakage around the catheter despite 2 separate sites for injection.  I worried as to whether there would be a cystic duct stump leak.  Dr. Coralie Keens came to the operating room to give his opinion.  We simply dissected another 1.5 cm length of the cystic duct which looked normal, secured it with multiple metal clips and divided it.  I placed the subhepatic drain but there was no bile leakage at the immediate postop.  I did not think there was  any injury to the common duct.  The bed of the gallbladder was somewhat raw but there was no bleeding or bile leak at the completion of the case.  For safety I plan to admit overnight for observation and check lab work in the morning  Procedure in Detail:          Following the induction of general endotracheal anesthesia the patient's abdomen was prepped and draped in a sterile fashion.  Intravenous antibiotics were given.  Surgical timeout was performed.  0.5% Marcaine with epinephrine was used as a local infiltration anesthetic.      A vertical incision was made in the lower rim of the umbilicus.  The fascia was incised in the midline the abdominal cavity was entered under direct vision.  An 11 mm Hassan trocar was inserted and secured with a pursestring suture of 0 Vicryl.  Pneumoperitoneum was created.  Video camera was inserted.  Four-quadrant inspection revealed no bleeding or visceral abnormality.  There was a little bit of chronic omental adhesions in the midline just below the umbilicus presumably from previous C-section.      A trocar was placed in the subxiphoid region and 2 trochars placed  in the right upper quadrant.  The gallbladder was easily identified and grasped.  I took down some chronic filmy adhesions and dissected out the infundibulum and the neck of the gallbladder.  I slowly dissected out the cystic duct and the cystic artery.  The cystic artery was isolated as it went on to the wall of the gallbladder, secured with multiple metal clips and divided.  I then created a large window behind the cystic duct and dissected this cystic duct out and skeletonized.  I inserted a cholangiogram catheter into the cystic duct and attempted a cholangiogram.  I had a fair amount of extravasation and this seemed to be high pressure.  I reposition the catheter and had the same problem with low flow and high pressure.  I extensively dissected out the cystic duct and made another opening of the cystic duct a little bit closer to the common duct.  The [facility] catheter was easily threaded into the cystic duct and secured with a clip.  Again I had almost no flow and a lot of extravasation back around the clip.  I could not detect any back wall injury to the cystic duct. Dr. Ninfa Linden came in the room for a second opinion and we elected not to dissect any further but to secure the distal cystic duct with several metal clips and divided it.  It appeared to be healthy.  The did not appear to be any injury distally or near the common duct.  There did not appear to be a stone in  the distal cystic duct.   The gallbladder was dissected from its bed and placed in a specimen bag and removed.      I then spent some time irrigating out the subhepatic and subphrenic spaces.  The irrigation fluid became clear.  I observed this for some time and no bile seem to be leaking from around the cystic duct or the bed of the gallbladder.  To be safe I chose to place a 60 [place] Blake drain in subhepatic space.  This was brought out through one of the trocar sites in the right upper quadrant, sutured the skin and connected to the suction  bulb.  The output from the bulb during the rest of the case was very low volume and nonbilious.  Four-quadrant inspection revealed no other abnormalities.  The pneumoperitoneum was released.  The fascia at the umbilicus was closed with 0 Vicryl sutures.  The skin incisions were closed with subcuticular 4-0 Monocryl and Dermabond.  The patient tolerated the procedure well and was taken to PACU in stable condition.     Edsel Petrin. Dalbert Batman, M.D., FACS General and Minimally Invasive Surgery Breast and Colorectal Surgery  01/24/2019 2:04 PM

## 2019-01-25 ENCOUNTER — Encounter (HOSPITAL_COMMUNITY): Payer: Self-pay | Admitting: General Surgery

## 2019-01-25 DIAGNOSIS — K8010 Calculus of gallbladder with chronic cholecystitis without obstruction: Secondary | ICD-10-CM | POA: Diagnosis not present

## 2019-01-25 LAB — COMPREHENSIVE METABOLIC PANEL
ALT: 27 U/L (ref 0–44)
AST: 27 U/L (ref 15–41)
Albumin: 3.5 g/dL (ref 3.5–5.0)
Alkaline Phosphatase: 44 U/L (ref 38–126)
Anion gap: 9 (ref 5–15)
BUN: 7 mg/dL (ref 6–20)
CO2: 25 mmol/L (ref 22–32)
Calcium: 9.2 mg/dL (ref 8.9–10.3)
Chloride: 105 mmol/L (ref 98–111)
Creatinine, Ser: 0.91 mg/dL (ref 0.44–1.00)
GFR calc Af Amer: >60 mL/min (ref >60)
GFR calc non Af Amer: >60 mL/min (ref >60)
Glucose, Bld: 121 mg/dL — ABNORMAL HIGH (ref 70–99)
Potassium: 4.1 mmol/L (ref 3.5–5.1)
Sodium: 139 mmol/L (ref 135–145)
Total Bilirubin: 0.4 mg/dL (ref 0.3–1.2)
Total Protein: 7 g/dL (ref 6.5–8.1)

## 2019-01-25 LAB — CBC
HCT: 39.2 % (ref 36.0–46.0)
Hemoglobin: 13.2 g/dL (ref 12.0–15.0)
MCH: 28.9 pg (ref 26.0–34.0)
MCHC: 33.7 g/dL (ref 30.0–36.0)
MCV: 86 fL (ref 80.0–100.0)
Platelets: 216 10*3/uL (ref 150–400)
RBC: 4.56 MIL/uL (ref 3.87–5.11)
RDW: 12.7 % (ref 11.5–15.5)
WBC: 8.7 10*3/uL (ref 4.0–10.5)
nRBC: 0 % (ref 0.0–0.2)

## 2019-01-25 LAB — SURGICAL PATHOLOGY

## 2019-01-25 MED ORDER — HYDROCODONE-ACETAMINOPHEN 5-325 MG PO TABS: 1.0000 | ORAL_TABLET | ORAL | 0 refills | Status: DC | PRN

## 2019-01-25 NOTE — Discharge Summary (Signed)
Patient ID: Dana Garcia [id number] y.o. 05-26-1985  Admit date: 01/24/2019  Discharge date and time: 01/25/2019  Admitting Physician: Adin Hector  Discharge Physician: Adin Hector  Admission Diagnoses: GALLSTONES  Discharge Diagnoses: Cholecystitis with cholelithiasis                                          BMI 40                                          History C-section                                          History of thyroidectomy  Operations: Procedure(s): LAPAROSCOPIC CHOLECYSTECTOMY WITh  INTRAOPERATIVE CHOLANGIOGRAM  Admission Condition: good  Discharged Condition: good  Indication for Admission: This is a pleasant 33 year old female, referred by Dr. Ronnald Nian in the ED for symptomatic gallstones. [name], PA provides primary care.  She's been having episodes of upper abdominal discomfort and nausea for some time. This is intermittent. She's not sure is related to meals. No diarrhea. She had a more severe attack on October 28 and went to the emergency room after eating chicken wings. Lab work showed AST 153, ALT 97, WBC 12,700. Lipase normal. CT scan showed gallstones and was otherwise normal. Ultrasound showed small stones and CBD normal. Question of some dilatation of the intrahepatic ducts. She was feeling fine when I saw her in the office recently and she is interested in cholecystectomy.      Preop liver function test have returned to normal. Past history significant for IUD in place. Partial thyroidectomy for benign disease. 2 children. One C-section. It is highly likely that she is having accelerating attacks of biliary colic. Elective cholecystectomy is recommended. She wants to go ahead with this . I discussed the indications, details, techniques and risk of the surgery with her. She is aware the risk of bleeding, infection, conversion to open laparotomy, port site hernia, pancreatitis, diarrhea, injury  to adjacent organs with major reconstructive surgery. She understands all of these issues. All of her questions were answered. She agrees with this plan.  Hospital Course: On the day of admission the patient was taken to the operating room and underwent laparoscopic cholecystectomy with intraoperative cholangiogram.  The gallbladder was chronically inflamed but the cholecystectomy was uneventful.  The cholangiogram showed normal biliary ducts, no filling defect, and good flow of contrast into the duodenum.  However, there was some extravasation which I felt was most likely around the clip and could not find any back wall injury.  I chose to carefully clip of the long cystic duct and placed a drain and observe overnight, given the unlikely possibility of a bile leak elsewhere.     She had no problems overnight and wanted to go home.  The drainage was low volume and serosanguineous.  No evidence of bile leak.  CBC and liver function test on postop day 1 were completely normal.     I removed the drain at the bedside on postop day 1 that was uneventful.  Her abdomen was soft nondistended.  She was ambulatory.  Voiding without difficulty.  Tolerating a diet  without nausea.  Diet and activities were discussed.  Follow-up was discussed.  Pain medicine was discussed.  I will see her back in the office in 2 to 3 weeks  Consults: None  Significant Diagnostic Studies: Blood test and surgical pathology  Treatments: surgery: Laparoscopic cholecystectomy with cholangiogram  Disposition: Home  Patient Instructions:  Allergies as of 01/25/2019      Reactions   Flagyl [metronidazole] Hives   Septra [sulfamethoxazole-trimethoprim] Hives      Medication List    STOP taking these medications   ibuprofen 600 MG tablet Commonly known as: ADVIL   loperamide 2 MG capsule Commonly known as: IMODIUM   ondansetron 4 MG disintegrating tablet Commonly known as: Zofran ODT   oxyCODONE-acetaminophen 5-325 MG  tablet Commonly known as: PERCOCET/ROXICET     TAKE these medications   dicyclomine 20 MG tablet Commonly known as: BENTYL Take 1 tablet (20 mg total) by mouth every 12 (twelve) hours as needed (for abdominal pain/cramping).   HYDROcodone-acetaminophen 5-325 MG tablet Commonly known as: NORCO/VICODIN Take 1-2 tablets by mouth every 4 (four) hours as needed for moderate pain.   ondansetron 4 MG tablet Commonly known as: ZOFRAN Take 1 tablet (4 mg total) by mouth every 6 (six) hours.            Discharge Care Instructions  (From admission, onward)         Start     Ordered   01/25/19 0000  Discharge wound care:    Comments: Keep a small bandage on the right sided drain site for a few days.  No bandage necessary once this becomes completely dry The clear plastic superglue on the other wounds will slowly peel off in about 3 weeks   01/25/19 0726          Activity: No sports or heavy lifting for 3 weeks.  Otherwise resume normal activities Diet: low fat, low cholesterol diet Wound Care: as directed  Follow-up:  With Dr. Dalbert Batman in 2-3 weeks    Addendum: I logged onto the PMP aware website and reviewed her prescription medication history.  Signed: Edsel Petrin. Dalbert Batman, M.D., FACS General and minimally invasive surgery Breast and Colorectal Surgery  01/25/2019, 7:27 AM

## 2019-01-25 NOTE — Progress Notes (Signed)
Patient discharged to home. Verbalizes understanding of all discharge instructions including incision care, discharge medications, and follow up MD visits. Patient awaiting ride.  

## 2019-10-14 IMAGING — US US OB TRANSVAGINAL
1 series · 15 of 28 positions shown · non-contrast
Comparison: None.

CLINICAL DATA: Viability, location

EXAM:
TRANSVAGINAL OB ULTRASOUND
TECHNIQUE: Transvaginal ultrasound was performed for complete evaluation of the
gestation as well as the maternal uterus, adnexal regions, and
pelvic cul-de-sac.

[Series 1: us ob transvaginal · 15 of 38 slices shown]
[im 1/38]
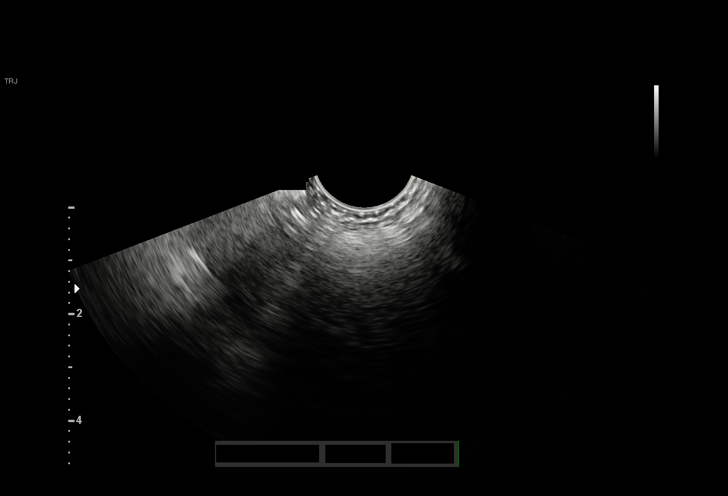
[im 3/38]
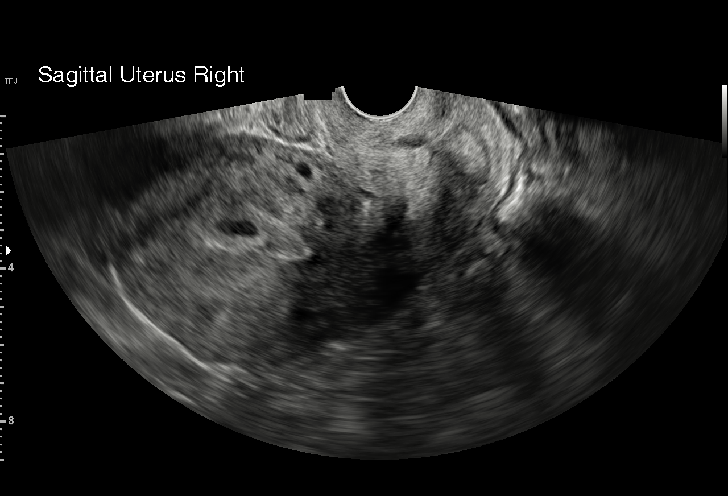
[im 6/38]
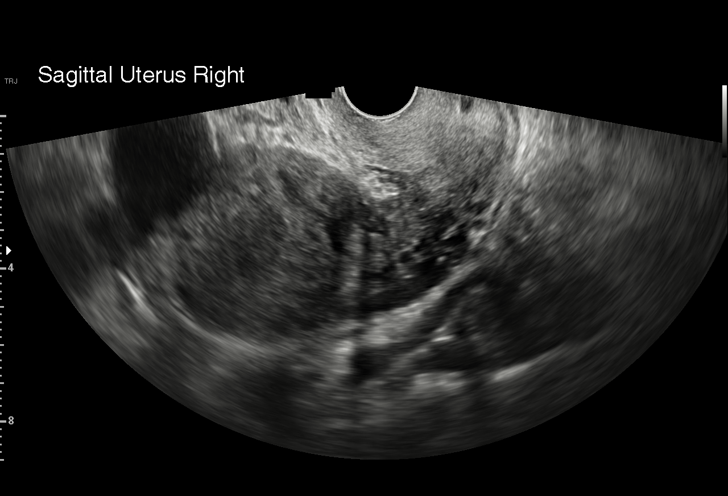
[im 9/38]
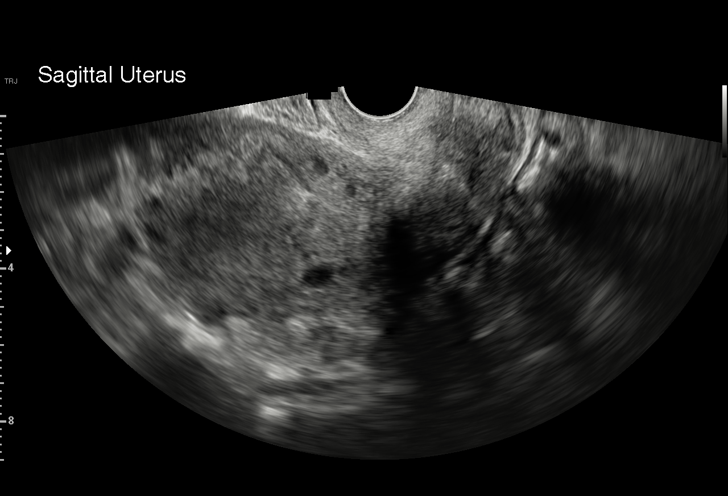
[im 11/38]
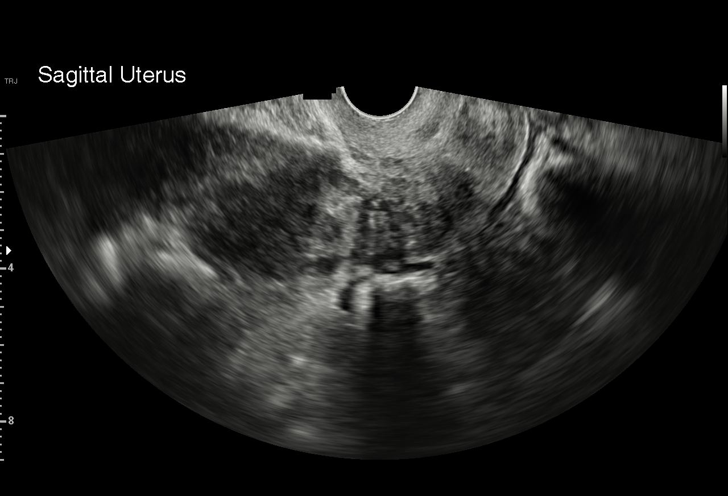
[im 14/38]
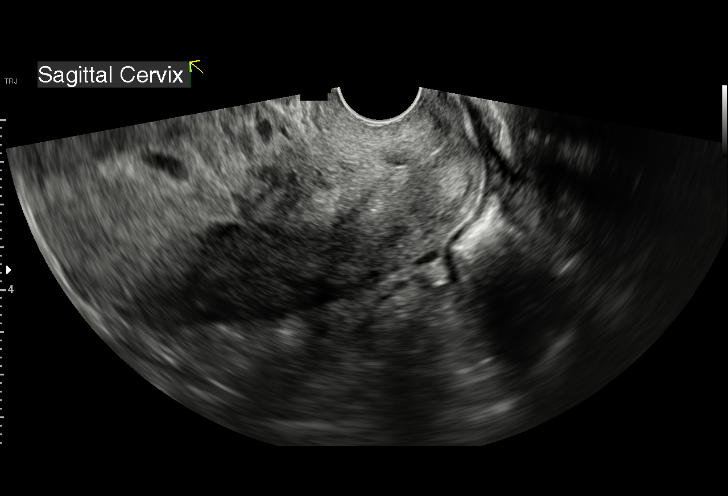
[im 17/38]
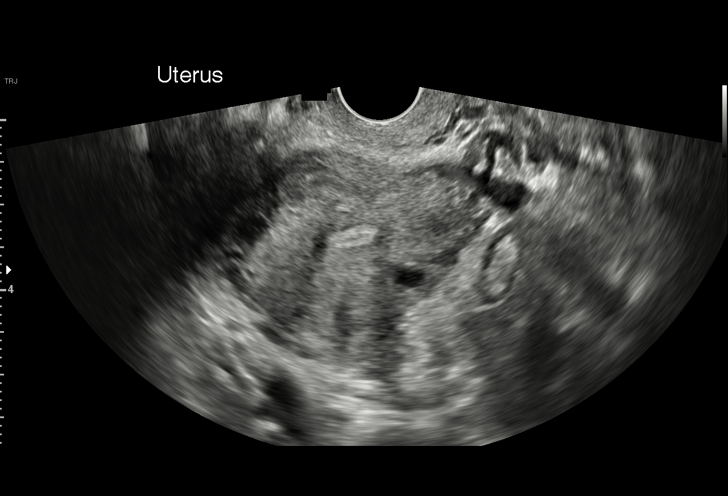
[im 20/38]
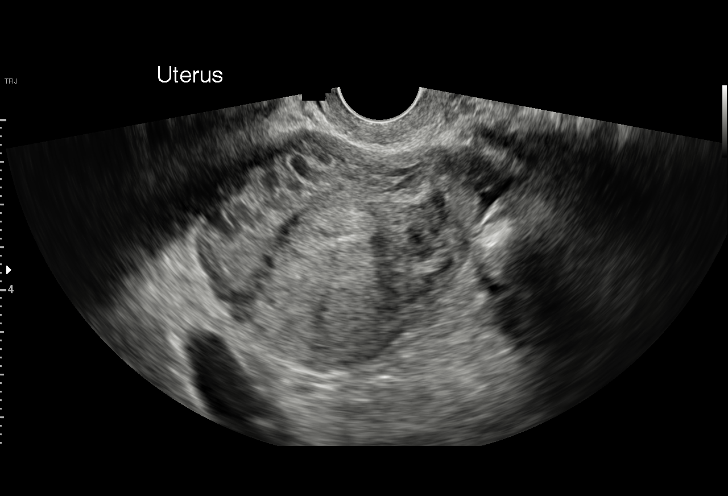
[im 21/38]
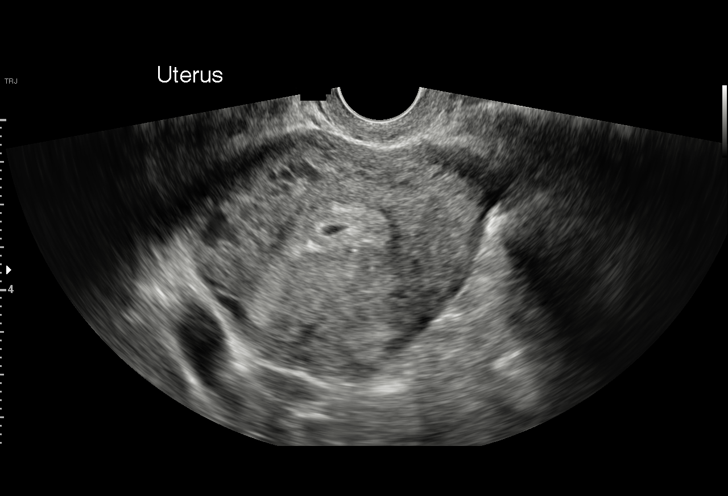
[im 24/38]
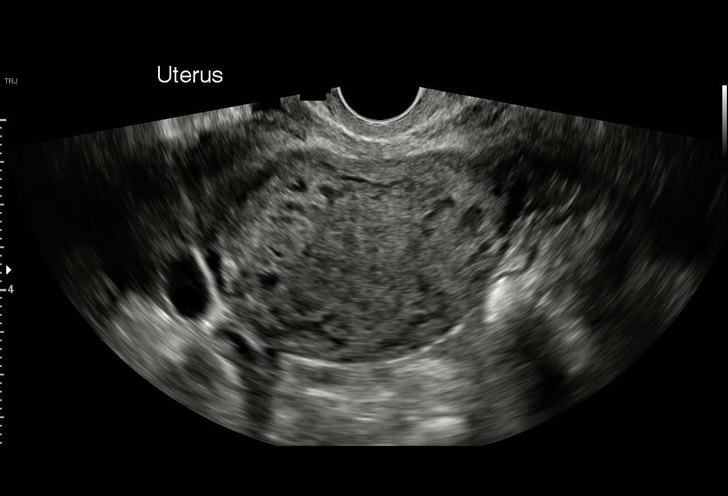
[im 27/38]
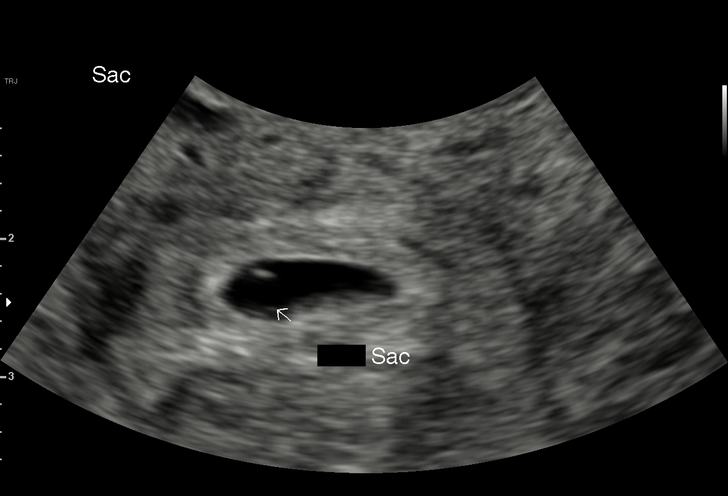
[im 29/38]
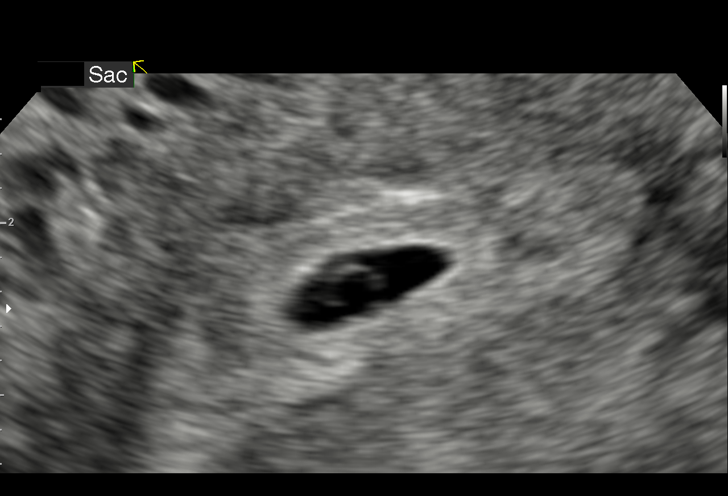
[im 32/38]
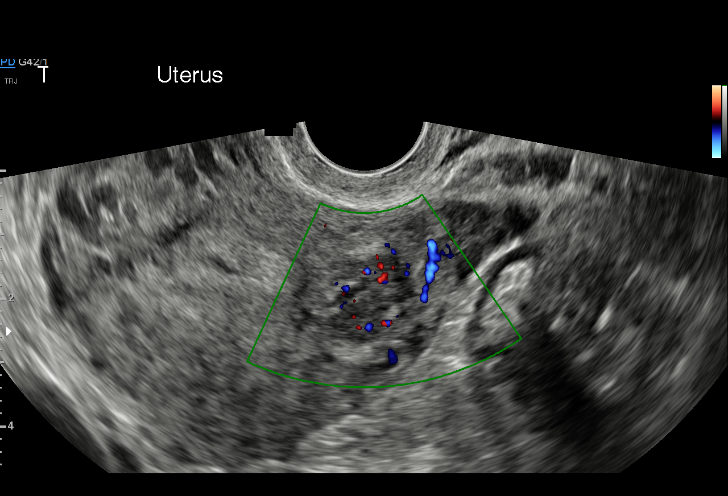
[im 35/38]
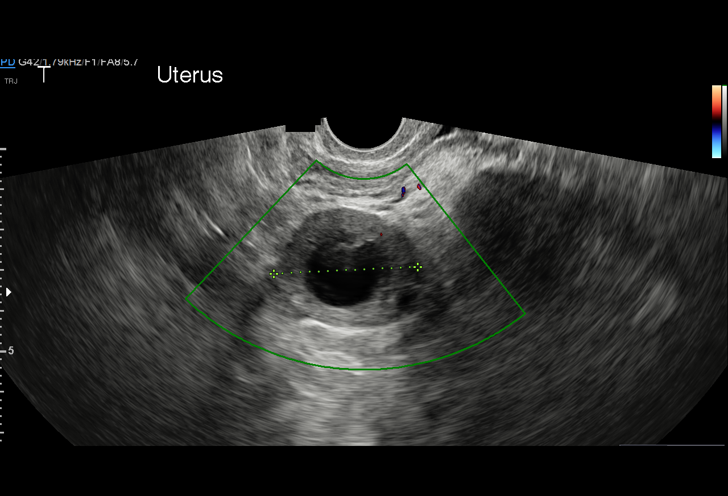
[im 38/38]
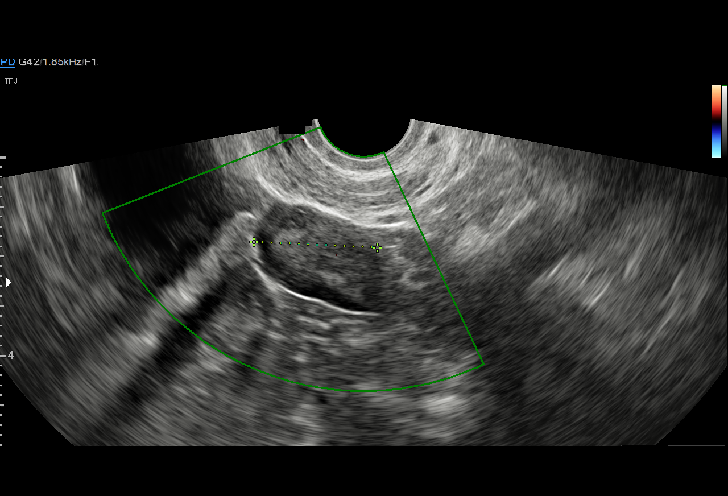

[15 of 28 positions shown; findings below may reference images not displayed]

FINDINGS: Intrauterine gestational sac: Single

Yolk sac:  Visualized

Embryo:  Not visualized

Cardiac Activity: Not visualized

Heart Rate:  bpm

MSD: 9.9 mm   5 w   5 d

CRL:     mm    w  d                  US EDC:

Subchorionic hemorrhage:  None visualized.

Maternal uterus/adnexae: No adnexal mass or free fluid. 1.7 cm left
uterine fibroid.
IMPRESSION: Early intrauterine gestational sac. Yolk sac is visualized, but no
fetal pole currently. Estimated gestational age by mean sac diameter
5 weeks 5 days. This could be followed with repeat ultrasound in 14
days to ensure expected progression.

## 2020-01-03 ENCOUNTER — Ambulatory Visit: Payer: Self-pay | Admitting: Podiatry

## 2020-01-08 ENCOUNTER — Ambulatory Visit: Payer: Self-pay | Admitting: Podiatry

## 2020-01-19 ENCOUNTER — Ambulatory Visit (INDEPENDENT_AMBULATORY_CARE_PROVIDER_SITE_OTHER): Payer: Medicaid Other | Admitting: Podiatry

## 2020-01-19 ENCOUNTER — Other Ambulatory Visit: Payer: Self-pay

## 2020-01-19 ENCOUNTER — Other Ambulatory Visit: Payer: Self-pay | Admitting: Podiatry

## 2020-01-19 DIAGNOSIS — Z79899 Other long term (current) drug therapy: Secondary | ICD-10-CM

## 2020-01-19 DIAGNOSIS — B351 Tinea unguium: Secondary | ICD-10-CM | POA: Diagnosis not present

## 2020-01-20 LAB — HEPATIC FUNCTION PANEL
ALT: 15 IU/L (ref 0–32)
AST: 17 IU/L (ref 0–40)
Albumin: 4.4 g/dL (ref 3.8–4.8)
Alkaline Phosphatase: 52 IU/L (ref 44–121)
Bilirubin Total: <0.2 mg/dL (ref 0.0–1.2)
Bilirubin, Direct: <0.1 mg/dL (ref 0.00–0.40)
Total Protein: 7.4 g/dL (ref 6.0–8.5)

## 2020-01-22 MED ORDER — TERBINAFINE HCL 250 MG PO TABS: 250.0000 mg | ORAL_TABLET | Freq: Every day | ORAL | 0 refills | Status: DC

## 2020-01-23 ENCOUNTER — Encounter: Payer: Self-pay | Admitting: Podiatry

## 2020-01-23 NOTE — Progress Notes (Signed)
Subjective:  Patient ID: Dana Garcia, female    DOB: 19-Apr-1985,  MRN: [id number]  Chief Complaint  Patient presents with  . Nail Problem    Left hallux nail is discolored and 2nd toe on left foot has a place on it. PT denies any pain     34 y.o. female presents with the above complaint. Patient presents with complaint of left hallux discoloration that has been going for 3 months or more.  Patient states that second toe is also strangulated discolored.  There is no pain associated with it.  However she is concerned there is fungus that she does not want to spread.  She would like to have it taken care of and treated.  She denies any other acute complaints.  She denies any history of liver.  She does not smoke.  She would like to discuss treatment options.  She has not seen anyone else prior to seeing me for this.   Review of Systems: Negative except as noted in the HPI. Denies N/V/F/Ch.  Past Medical History:  Diagnosis Date  . Cholecystitis with cholelithiasis 01/24/2019  . Gallstones   . Medical history non-contributory     Current Outpatient Medications:  .  Cholecalciferol 1.25 MG (50000 UT) capsule, Take by mouth., Disp: , Rfl:  .  metoCLOPramide (REGLAN) 10 MG tablet, Take one tab the morning of surgery then one every 6-8 hours as needed for nausea (after surgery)., Disp: , Rfl:  .  acetaminophen (TYLENOL) 325 MG tablet, Take by mouth., Disp: , Rfl:  .  cefdinir (OMNICEF) 300 MG capsule, Take by mouth., Disp: , Rfl:  .  Cholecalciferol (VITAMIN D3) 1.25 MG (50000 UT) CAPS, Take by mouth., Disp: , Rfl:  .  dicyclomine (BENTYL) 20 MG tablet, Take 1 tablet (20 mg total) by mouth every 12 (twelve) hours as needed (for abdominal pain/cramping)., Disp: 20 tablet, Rfl: 0 .  HYDROcodone-acetaminophen (NORCO/VICODIN) 5-325 MG tablet, Take 1-2 tablets by mouth every 4 (four) hours as needed for moderate pain., Disp: 20 tablet, Rfl: 0 .  ipratropium (ATROVENT) 0.03 % nasal spray,  Place 2 sprays into both nostrils 4 (four) times daily., Disp: , Rfl:  .  levofloxacin (LEVAQUIN) 500 MG tablet, Take 500 mg by mouth daily., Disp: , Rfl:  .  loratadine (CLARITIN) 10 MG tablet, Take 10 mg by mouth daily., Disp: , Rfl:  .  omeprazole (PRILOSEC) 40 MG capsule, Take 40 mg by mouth daily., Disp: , Rfl:  .  ondansetron (ZOFRAN) 4 MG tablet, Take 1 tablet (4 mg total) by mouth every 6 (six) hours., Disp: 12 tablet, Rfl: 0 .  ondansetron (ZOFRAN-ODT) 8 MG disintegrating tablet, Take by mouth., Disp: , Rfl:  .  terbinafine (LAMISIL) 250 MG tablet, Take 1 tablet (250 mg total) by mouth daily., Disp: 90 tablet, Rfl: 0 .  ursodiol (ACTIGALL) 250 MG tablet, Take 250 mg by mouth 2 (two) times daily., Disp: , Rfl:   Social History   Tobacco Use  Smoking Status Never Smoker  Smokeless Tobacco Never Used    Allergies  Allergen Reactions  . Flagyl [Metronidazole] Hives  . Septra [Sulfamethoxazole-Trimethoprim] Hives   Objective:  There were no vitals filed for this visit. There is no height or weight on file to calculate BMI. Constitutional Well developed. Well nourished.  Vascular Dorsalis pedis pulses palpable bilaterally. Posterior tibial pulses palpable bilaterally. Capillary refill normal to all digits.  No cyanosis or clubbing noted. Pedal hair growth normal.  Neurologic Normal speech.  Oriented to person, place, and time. Epicritic sensation to light touch grossly present bilaterally.  Dermatologic  dystrophic mycotic discolored toenails noted to the left hallux and mildly to the second digit.  No pain on palpation.  Orthopedic: Normal joint ROM without pain or crepitus bilaterally. No visible deformities. No bony tenderness.   Radiographs: None Assessment:   1. Long-term use of high-risk medication   2. Nail fungus   3. Onychomycosis due to dermatophyte    Plan:  Patient was evaluated and treated and all questions answered.  Left hallux/second digit  onychomycosis/nail fungus -Educated the patient on the etiology of onychomycosis and various treatment options associated with improving the fungal load.  I explained to the patient that there is 3 treatment options available to treat the onychomycosis including topical, p.o., laser treatment.  Patient elected to undergo p.o. options with Lamisil/terbinafine therapy.  In order for me to start the medication therapy, I explained to the patient the importance of evaluating the liver and obtaining the liver function test.  Once the liver function test comes back normal I will start him on 19-month course of Lamisil therapy.  Patient understood all risk and would like to proceed with Lamisil therapy.  I have asked the patient to immediately stop the Lamisil therapy if she has any reactions to it and call the office or go to the emergency room right away.  Patient states understanding   No follow-ups on file.

## 2020-05-22 ENCOUNTER — Other Ambulatory Visit: Payer: Self-pay

## 2020-05-22 ENCOUNTER — Ambulatory Visit: Payer: Medicaid Other | Admitting: Podiatry

## 2020-05-22 ENCOUNTER — Encounter: Payer: Self-pay | Admitting: Podiatry

## 2020-05-22 DIAGNOSIS — Z79899 Other long term (current) drug therapy: Secondary | ICD-10-CM

## 2020-05-22 DIAGNOSIS — B351 Tinea unguium: Secondary | ICD-10-CM

## 2020-05-22 NOTE — Progress Notes (Signed)
Subjective:  Patient ID: Dana Garcia, female    DOB: 1985-12-29,  MRN: [id number]  Chief Complaint  Patient presents with  . Nail Problem    PT stated that she does see much of a difference with her nail    35 y.o. female presents with the above complaint.  Patient presents with a follow-up of left hallux onychomycosis.  She states that she has not been compliant with taking Lamisil because it gives her yeast infection.  She would like to know if there is any other treatment options that are available.  She denies any other acute complaints.   Review of Systems: Negative except as noted in the HPI. Denies N/V/F/Ch.  Past Medical History:  Diagnosis Date  . Cholecystitis with cholelithiasis 01/24/2019  . Gallstones   . Medical history non-contributory     Current Outpatient Medications:  .  acetaminophen (TYLENOL) 325 MG tablet, Take by mouth., Disp: , Rfl:  .  cefdinir (OMNICEF) 300 MG capsule, Take by mouth., Disp: , Rfl:  .  Cholecalciferol (VITAMIN D3) 1.25 MG (50000 UT) CAPS, Take by mouth., Disp: , Rfl:  .  Cholecalciferol 1.25 MG (50000 UT) capsule, Take by mouth., Disp: , Rfl:  .  dicyclomine (BENTYL) 20 MG tablet, Take 1 tablet (20 mg total) by mouth every 12 (twelve) hours as needed (for abdominal pain/cramping)., Disp: 20 tablet, Rfl: 0 .  HYDROcodone-acetaminophen (NORCO/VICODIN) 5-325 MG tablet, Take 1-2 tablets by mouth every 4 (four) hours as needed for moderate pain., Disp: 20 tablet, Rfl: 0 .  ipratropium (ATROVENT) 0.03 % nasal spray, Place 2 sprays into both nostrils 4 (four) times daily., Disp: , Rfl:  .  levofloxacin (LEVAQUIN) 500 MG tablet, Take 500 mg by mouth daily., Disp: , Rfl:  .  loratadine (CLARITIN) 10 MG tablet, Take 10 mg by mouth daily., Disp: , Rfl:  .  metoCLOPramide (REGLAN) 10 MG tablet, Take one tab the morning of surgery then one every 6-8 hours as needed for nausea (after surgery)., Disp: , Rfl:  .  omeprazole (PRILOSEC) 40 MG capsule,  Take 40 mg by mouth daily., Disp: , Rfl:  .  ondansetron (ZOFRAN) 4 MG tablet, Take 1 tablet (4 mg total) by mouth every 6 (six) hours., Disp: 12 tablet, Rfl: 0 .  ondansetron (ZOFRAN-ODT) 8 MG disintegrating tablet, Take by mouth., Disp: , Rfl:  .  terbinafine (LAMISIL) 250 MG tablet, Take 1 tablet (250 mg total) by mouth daily., Disp: 90 tablet, Rfl: 0 .  ursodiol (ACTIGALL) 250 MG tablet, Take 250 mg by mouth 2 (two) times daily., Disp: , Rfl:   Social History   Tobacco Use  Smoking Status Never Smoker  Smokeless Tobacco Never Used    Allergies  Allergen Reactions  . Flagyl [Metronidazole] Hives  . Septra [Sulfamethoxazole-Trimethoprim] Hives   Objective:  There were no vitals filed for this visit. There is no height or weight on file to calculate BMI. Constitutional Well developed. Well nourished.  Vascular Dorsalis pedis pulses palpable bilaterally. Posterior tibial pulses palpable bilaterally. Capillary refill normal to all digits.  No cyanosis or clubbing noted. Pedal hair growth normal.  Neurologic Normal speech. Oriented to person, place, and time. Epicritic sensation to light touch grossly present bilaterally.  Dermatologic  dystrophic mycotic discolored toenails noted to the left hallux and mildly to the second digit.  No pain on palpation.  Orthopedic: Normal joint ROM without pain or crepitus bilaterally. No visible deformities. No bony tenderness.   Radiographs: None Assessment:  1. Long-term use of high-risk medication   2. Nail fungus   3. Onychomycosis due to dermatophyte    Plan:  Patient was evaluated and treated and all questions answered.  Left hallux/second digit onychomycosis/nail fungus -She has been noncompliant with taking Lamisil as instructed.  She states that given that it gives her yeast infection she has not been able to take it consistently.  She will see her primary care doctor to see if there is anything can be done for the yeast  infection.  I discussed with her the other options that are available for her for nail fungus.  She has declined other options.  For now I instructed her that she can take Lamisil as long as she is taking medication for yeast infection as well.  She states understanding.  She will complete the 25-month Lamisil course and come back in to see me.   No follow-ups on file.

## 2020-05-24 ENCOUNTER — Telehealth: Payer: Self-pay | Admitting: Podiatry

## 2020-05-24 MED ORDER — TERBINAFINE HCL 250 MG PO TABS: 250.0000 mg | ORAL_TABLET | Freq: Every day | ORAL | 0 refills | Status: DC

## 2020-05-24 NOTE — Addendum Note (Signed)
Addended by: Boneta Lucks on: 05/24/2020 12:04 PM   Modules accepted: Orders

## 2020-05-24 NOTE — Telephone Encounter (Signed)
Patient calling to request more antibiotic medication (terbinafine (LAMISIL) 250 MG tablet) she is almost out.

## 2020-06-05 IMAGING — US US MFM FETAL BPP W/O NON-STRESS
1 series · 12 of 20 positions shown · non-contrast
Comparison: none

[Series 1: us mfm fetal bpp w/o non-stress · 20 acquisitions, 12 frames shown]
[im 1/20]
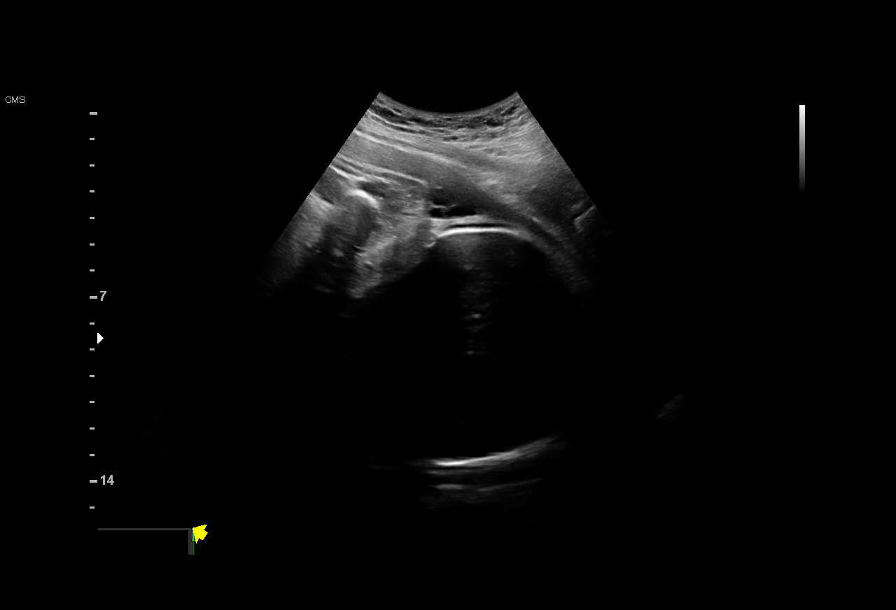
[im 3/20]
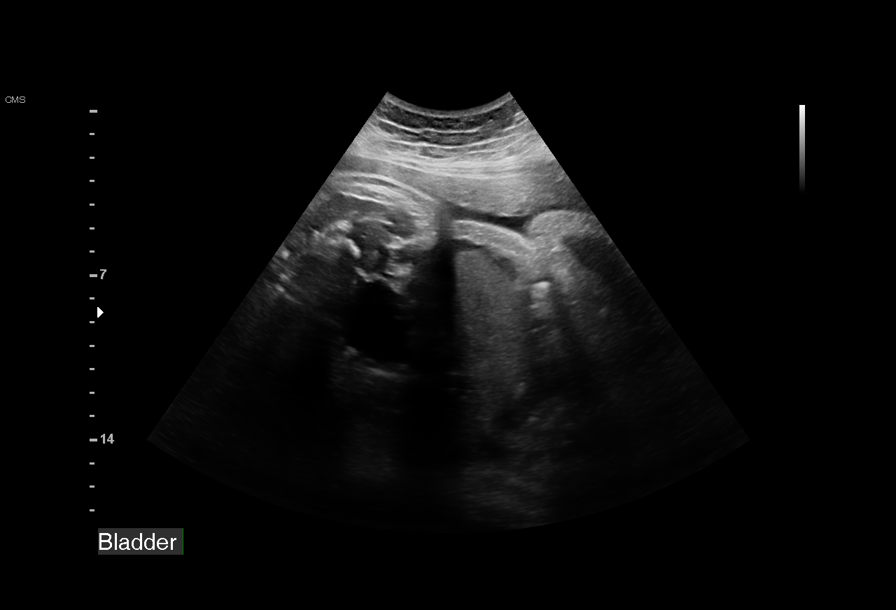
[im 5/20]
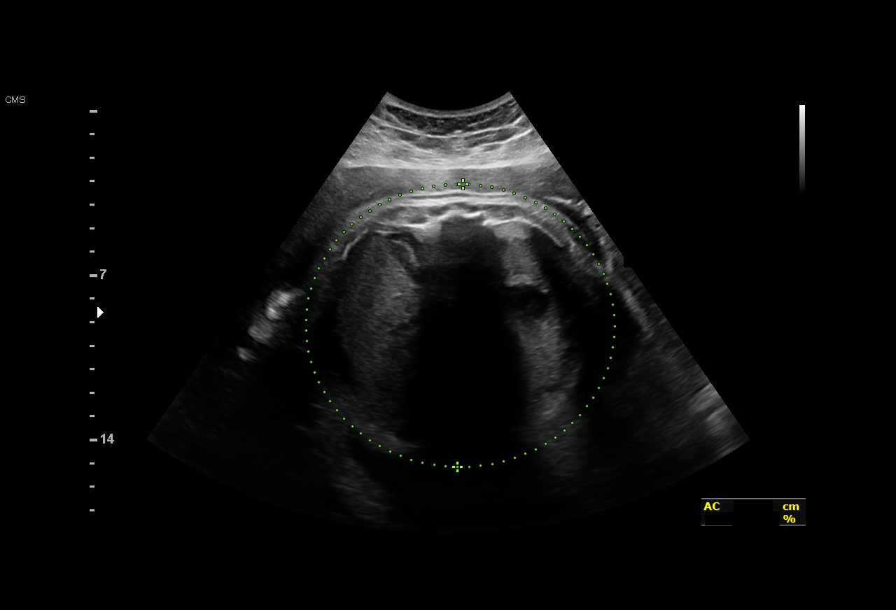
[im 6/20]
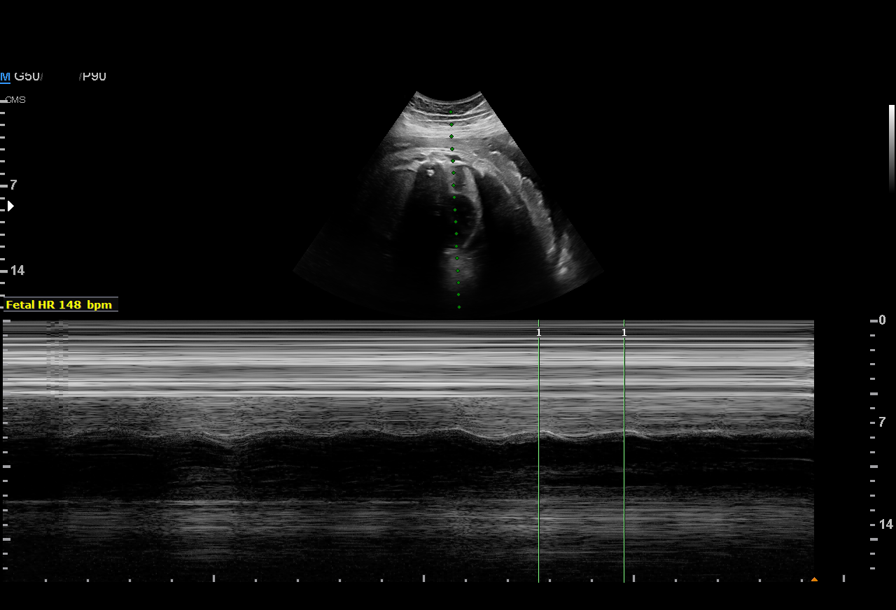
[im 8/20]
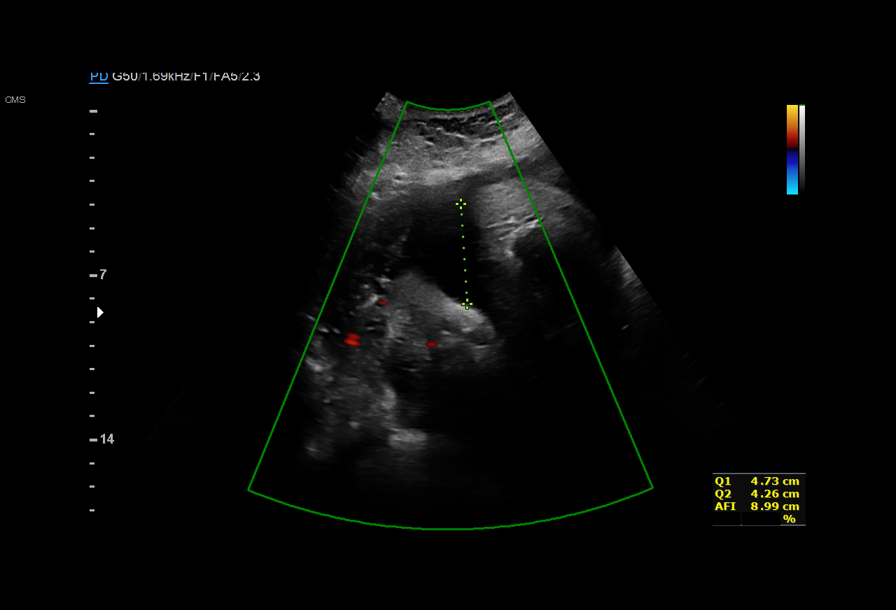
[im 10/20]
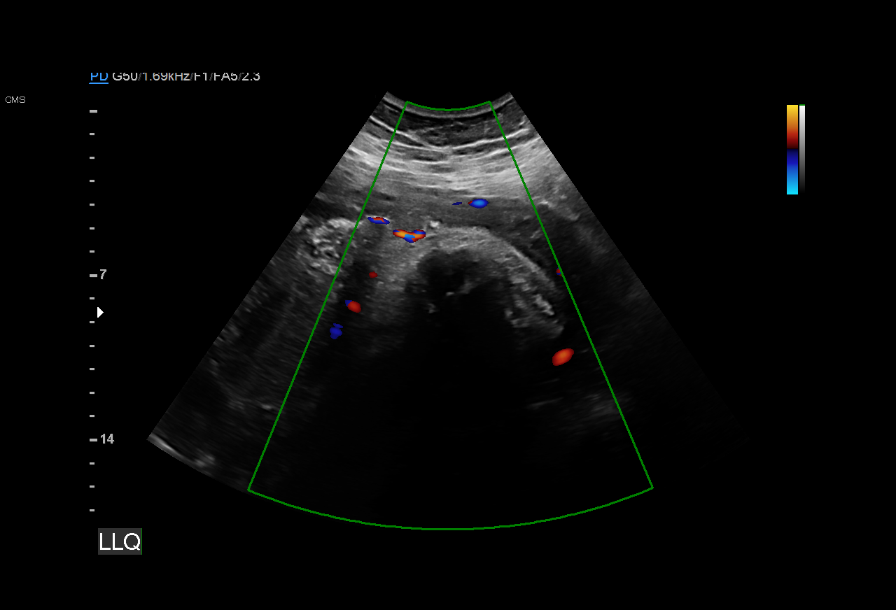
[im 11/20]
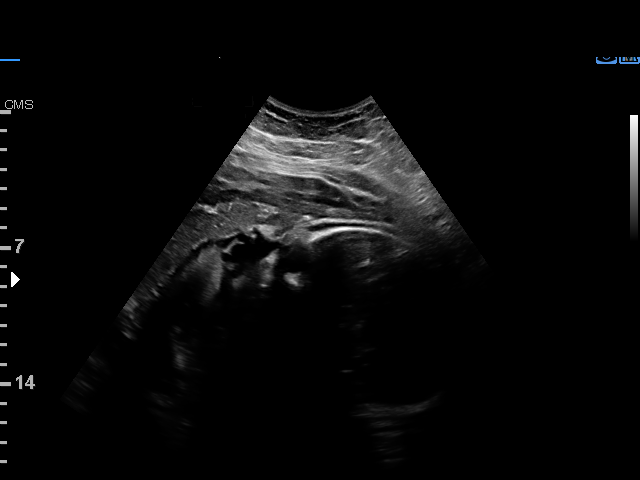
[im 13/20]
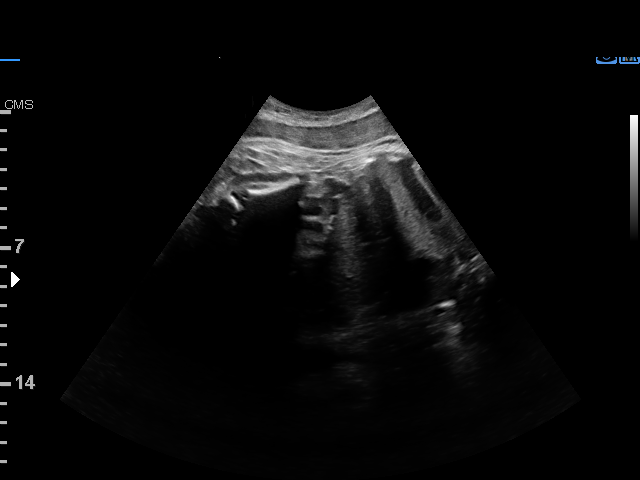
[im 15/20]
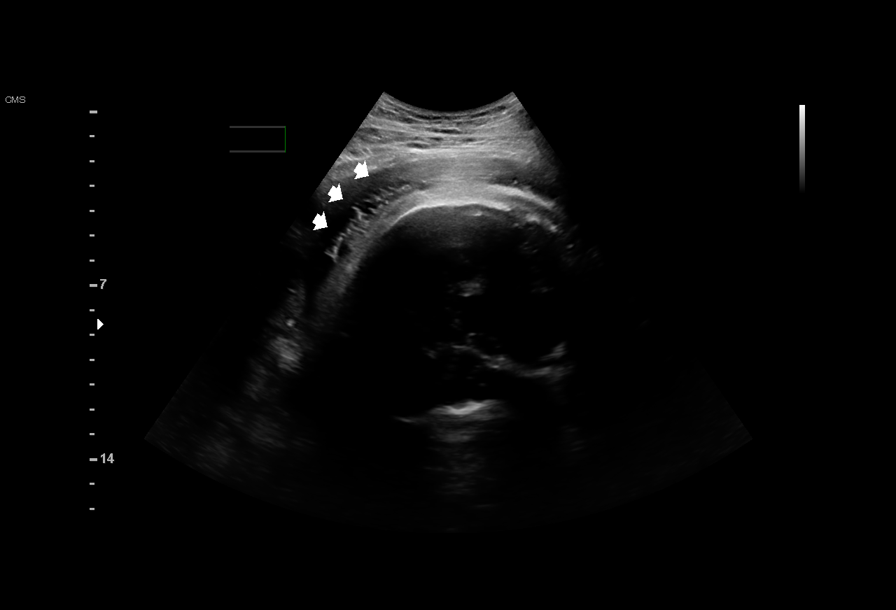
[im 16/20]
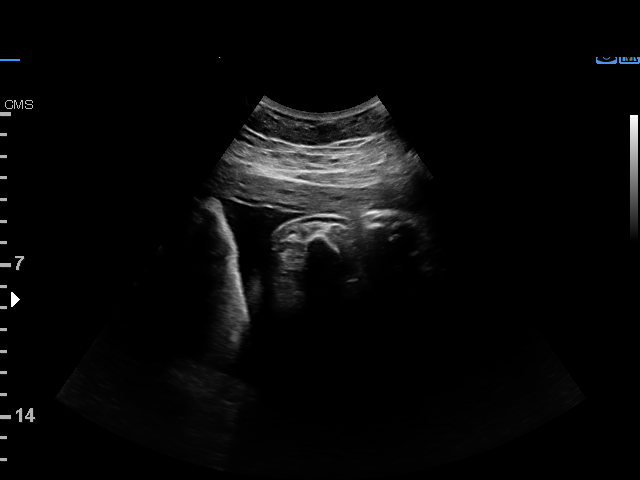
[im 18/20]
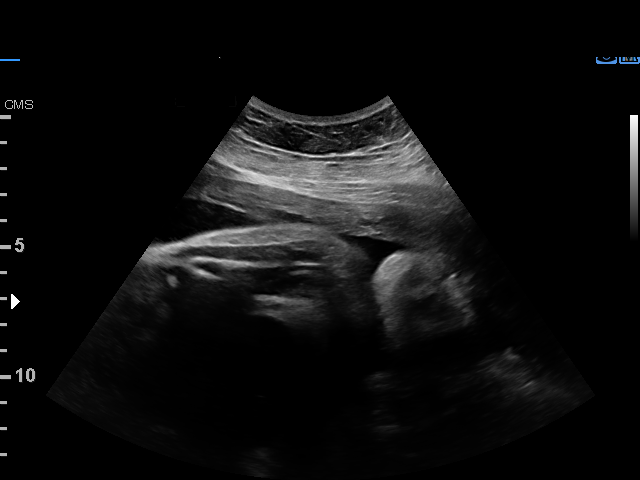
[im 20/20]
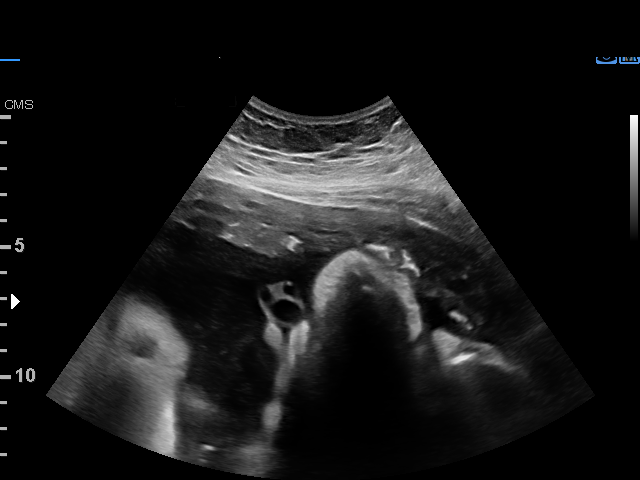

[12 of 20 positions shown; findings below may reference images not displayed]

----------------------------------------------------------------------

 ----------------------------------------------------------------------
Indications

  39 weeks gestation of pregnancy
  Decreased fetal movements, third trimester,
  unspecified
 ----------------------------------------------------------------------
Fetal Evaluation

 Num Of Fetuses:          1
 Fetal Heart Rate(bpm):   148
 Cardiac Activity:        Observed
 Presentation:            Cephalic

 Amniotic Fluid
 AFI FV:      Within normal limits

 AFI Sum(cm)     %Tile       Largest Pocket(cm)
 11.28           39

 RUQ(cm)                     LUQ(cm)        LLQ(cm)

Biophysical Evaluation

 Amniotic F.V:   Within normal limits       F. Tone:         Observed
 F. Movement:    Observed                   Score:           [DATE]
 F. Breathing:   Observed
Gestational Age

 LMP:           39w 1d        Date:  07/29/17                 EDD:   05/05/18
 Best:          39w 1d     Det. By:  LMP  (07/29/17)          EDD:   05/05/18
Impression

 Patient was evaluated for c/o decreased fetal movements.
 Amniotic fluid is normal and good fetal activity is seen.
 Antenatal testing is reassuring. BPP [DATE].
                 Mariola, Myjnia Bezdotykowa

## 2020-07-24 ENCOUNTER — Ambulatory Visit
Admission: EM | Admit: 2020-07-24 | Discharge: 2020-07-24 | Disposition: A | Discharge status: Home / Self Care | Payer: Medicaid Other | Attending: Emergency Medicine | Admitting: Emergency Medicine

## 2020-07-24 ENCOUNTER — Other Ambulatory Visit: Payer: Self-pay

## 2020-07-24 DIAGNOSIS — Z206 Contact with and (suspected) exposure to human immunodeficiency virus [HIV]: Secondary | ICD-10-CM | POA: Diagnosis not present

## 2020-07-24 MED ORDER — EMTRICITABINE-TENOFOVIR DF 200-300 MG PO TABS: 1.0000 | ORAL_TABLET | Freq: Every day | ORAL | 0 refills | Status: DC

## 2020-07-24 MED ORDER — DOLUTEGRAVIR SODIUM 50 MG PO TABS: 50.0000 mg | ORAL_TABLET | Freq: Every day | ORAL | 0 refills | Status: DC

## 2020-07-24 NOTE — Discharge Instructions (Addendum)
Blood work pending for HIV and Hepatitis screening Follow up with occupational health/infectious disease Begin post exposure prophylaxis meds daily

## 2020-07-24 NOTE — ED Triage Notes (Signed)
Pt states pricked her lt index finger with the lancet needle she checked her clients sugar. States her client is HIV + .

## 2020-07-25 NOTE — ED Provider Notes (Signed)
[facility]    CSN: [id number] Arrival date & time: 07/24/20  1918      History   Chief Complaint Chief Complaint  Patient presents with  . Body Fluid Exposure    HPI Dana Garcia is a 35 y.o. female presenting today for evaluation of occupational fingerstick.  Reports that she was checking patient's sugar and accidentally pricked her finger with the lancet.  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  Incident occurred to her left index finger.  HPI  Past Medical History:  Diagnosis Date  . Cholecystitis with cholelithiasis 01/24/2019  . Gallstones   . Medical history non-contributory     Patient Active Problem List   Diagnosis Date Noted  . Cholecystitis with cholelithiasis 01/24/2019  . Indication for care in labor or delivery 05/06/2018  . Abdominal pain affecting pregnancy 11/19/2017  . Edema during pregnancy in second trimester 11/19/2017  . 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 Supervision of other normal pregnancy, antepartum 10/19/2017    Past Surgical History:  Procedure Laterality Date  . CESAREAN SECTION N/A 05/06/2018   Procedure: CESAREAN SECTION;  Surgeon: Allyn Kenner, DO;  Location: [facility];  Service: Obstetrics;  Laterality: N/A;  . CHOLECYSTECTOMY  01/24/2019  . CHOLECYSTECTOMY N/A 01/24/2019   Procedure: LAPAROSCOPIC CHOLECYSTECTOMY WITH ATTEMPTED INTRAOPERATIVE CHOLANGIOGRAM;  Surgeon: Fanny Skates, MD;  Location: [place];  Service: General;  Laterality: N/A;  . DILATION AND CURETTAGE OF UTERUS    . THYROIDECTOMY, PARTIAL    . WISDOM TOOTH EXTRACTION      OB History    Gravida  3   Para  2   Term  2   Preterm      AB  1   Living  2     SAB      IAB  1   Ectopic      Multiple  0   Live Births  2            Home Medications    Prior to Admission medications   Medication Sig Start Date End Date Taking? Authorizing Provider  dolutegravir (TIVICAY) 50 MG tablet Take 1 tablet (50 mg  total) by mouth daily. 07/24/20  Yes Twilia Yaklin C, PA-C  emtricitabine-tenofovir (TRUVADA) 200-300 MG tablet Take 1 tablet by mouth daily. 07/24/20  Yes Jacy Brocker C, PA-C  acetaminophen (TYLENOL) 325 MG tablet Take by mouth.    [provider]  Cholecalciferol (VITAMIN D3) 1.25 MG (50000 UT) CAPS Take by mouth. 09/26/19   [provider]  Cholecalciferol 1.25 MG (50000 UT) capsule Take by mouth. 09/26/19 09/25/20  [provider]  ipratropium (ATROVENT) 0.03 % nasal spray Place 2 sprays into both nostrils 4 (four) times daily. 10/09/19   [provider]  loratadine (CLARITIN) 10 MG tablet Take 10 mg by mouth daily. 08/15/19   [provider]  metoCLOPramide (REGLAN) 10 MG tablet Take one tab the morning of surgery then one every 6-8 hours as needed for nausea (after surgery). 01/10/20   [provider]  omeprazole (PRILOSEC) 40 MG capsule Take 40 mg by mouth daily. 01/10/20   [provider]  ursodiol (ACTIGALL) 250 MG tablet Take 250 mg by mouth 2 (two) times daily. 01/11/20   [provider]    Family History Family History  Problem Relation Age of Onset  . Hypertension Other   . Diabetes Other   . Hypertension Mother   . Diabetes Maternal Grandmother   .  Diabetes Maternal Grandfather   . Kidney disease Paternal Grandmother     Social History Social History   Tobacco Use  . Smoking status: Never Smoker  . Smokeless tobacco: Never Used  Vaping Use  . Vaping Use: Never used  Substance Use Topics  . Alcohol use: Yes    Comment: social  wine/liquor  . Drug use: Not Currently    Types: Marijuana    Comment: last in 2005     Allergies   Flagyl [metronidazole] and Septra [sulfamethoxazole-trimethoprim]   Review of Systems Review of Systems  Constitutional: Negative for fatigue and fever.  HENT: Negative for mouth sores.   Eyes: Negative for visual disturbance.  Respiratory: Negative for shortness of  breath.   Cardiovascular: Negative for chest pain.  Gastrointestinal: Negative for abdominal pain, nausea and vomiting.  Genitourinary: Negative for genital sores.  Musculoskeletal: Negative for arthralgias and joint swelling.  Skin: Negative for color change, rash and wound.  Neurological: Negative for dizziness, weakness, light-headedness and headaches.     Physical Exam Triage Vital Signs ED Triage Vitals  Enc Vitals Group     BP 07/24/20 2020 137/90     Pulse Rate 07/24/20 2020 73     Resp 07/24/20 2020 18     Temp 07/24/20 2020 98.3 F (36.8 C)     Temp Source 07/24/20 2020 Oral     SpO2 07/24/20 2020 99 %     Weight --      Height --      Head Circumference --      Peak Flow --      Pain Score 07/24/20 2043 0     Pain Loc --      Pain Edu? --      Excl. in [place]? --    No data found.  Updated Vital Signs BP 137/90 (BP Location: Left Arm)   Pulse 73   Temp 98.3 F (36.8 C) (Oral)   Resp 18   SpO2 99%   Breastfeeding No   Visual Acuity Right Eye Distance:   Left Eye Distance:   Bilateral Distance:    Right Eye Near:   Left Eye Near:    Bilateral Near:     Physical Exam Vitals and nursing note reviewed.  Constitutional:      Appearance: She is well-developed.     Comments: No acute distress  HENT:     Head: Normocephalic and atraumatic.     Nose: Nose normal.  Eyes:     Conjunctiva/sclera: Conjunctivae normal.  Cardiovascular:     Rate and Rhythm: Normal rate.  Pulmonary:     Effort: Pulmonary effort is normal. No respiratory distress.  Abdominal:     General: There is no distension.  Musculoskeletal:        General: Normal range of motion.     Cervical back: Neck supple.  Skin:    General: Skin is warm and dry.  Neurological:     Mental Status: She is alert and oriented to person, place, and time.      UC Treatments / Results  Labs (all labs ordered are listed, but only abnormal results are displayed) Labs Reviewed  COMPREHENSIVE  METABOLIC PANEL  HEPATITIS C ANTIBODY  HEPATITIS B SURFACE ANTIGEN  RAPID HIV SCREEN (HIV 1/2 AB+AG)    EKG   Radiology No results found.  Procedures Procedures (including critical care time)  Medications Ordered in UC Medications - No data to display  Initial Impression / Assessment and  Plan / UC Course  I have reviewed the triage vital signs and the nursing notes.  Pertinent labs & imaging results that were available during my care of the patient were reviewed by me and considered in my medical decision making (see chart for details).     Blood work pending for HIV and hepatitis, encouraged follow-up with occupational health/infectious disease for further follow-up, routine screenings to further screen for HIV.  Given patient with known HIV positive and patient is within 72 hours, will go ahead and start postexposure prophylaxis.  Provided Truvada and Tivicay.  Discussed strict return precautions. Patient verbalized understanding and is agreeable with plan.  Final Clinical Impressions(s) / UC Diagnoses   Final diagnoses:  HIV exposure from body fluids     Discharge Instructions     Blood work pending for HIV and Hepatitis screening Follow up with occupational health/infectious disease Begin post exposure prophylaxis meds daily     ED Prescriptions    Medication Sig Dispense Auth. Provider   dolutegravir (TIVICAY) 50 MG tablet Take 1 tablet (50 mg total) by mouth daily. 30 tablet Labrea Eccleston C, PA-C   emtricitabine-tenofovir (TRUVADA) 200-300 MG tablet Take 1 tablet by mouth daily. 30 tablet Anaysha Andre, [place] C, PA-C     PDMP not reviewed this encounter.   Janith Lima, [place] 07/25/20 [id number]

## 2020-07-26 LAB — COMPREHENSIVE METABOLIC PANEL
ALT: 14 IU/L (ref 0–32)
AST: 17 IU/L (ref 0–40)
Albumin/Globulin Ratio: 1.8 (ref 1.2–2.2)
Albumin: 4.4 g/dL (ref 3.8–4.8)
Alkaline Phosphatase: 63 IU/L (ref 44–121)
BUN/Creatinine Ratio: 11 (ref 9–23)
BUN: 8 mg/dL (ref 6–20)
Bilirubin Total: 0.2 mg/dL (ref 0.0–1.2)
CO2: 23 mmol/L (ref 20–29)
Calcium: 9.4 mg/dL (ref 8.7–10.2)
Chloride: 104 mmol/L (ref 96–106)
Creatinine, Ser: 0.76 mg/dL (ref 0.57–1.00)
Globulin, Total: 2.5 g/dL (ref 1.5–4.5)
Glucose: 103 mg/dL — ABNORMAL HIGH (ref 65–99)
Potassium: 4.1 mmol/L (ref 3.5–5.2)
Sodium: 143 mmol/L (ref 134–144)
Total Protein: 6.9 g/dL (ref 6.0–8.5)
eGFR: 104 mL/min/{1.73_m2} (ref >59)

## 2020-07-26 LAB — HEPATITIS B SURFACE ANTIGEN: Hepatitis B Surface Ag: NEGATIVE

## 2020-07-26 LAB — HEPATITIS C ANTIBODY: Hep C Virus Ab: 0.1 s/co ratio (ref 0.0–0.9)

## 2020-08-19 ENCOUNTER — Telehealth: Payer: Self-pay | Admitting: Podiatry

## 2020-08-19 NOTE — Telephone Encounter (Signed)
Patient is requesting a virtual visit for toenail fungus. Patient has an iphone

## 2020-08-21 ENCOUNTER — Ambulatory Visit: Payer: Medicaid Other | Admitting: Podiatry

## 2021-02-10 IMAGING — US US ABDOMEN LIMITED
1 series · 14 of 25 positions shown · non-contrast
Comparison: CT abdomen and pelvis today.

CLINICAL DATA: Right upper quadrant pain with nausea and vomiting
for 1 day.

EXAM:
ULTRASOUND ABDOMEN LIMITED RIGHT UPPER QUADRANT

[Series 1: us abdomen limited · 14 of 56 slices shown]
[im 1/56]
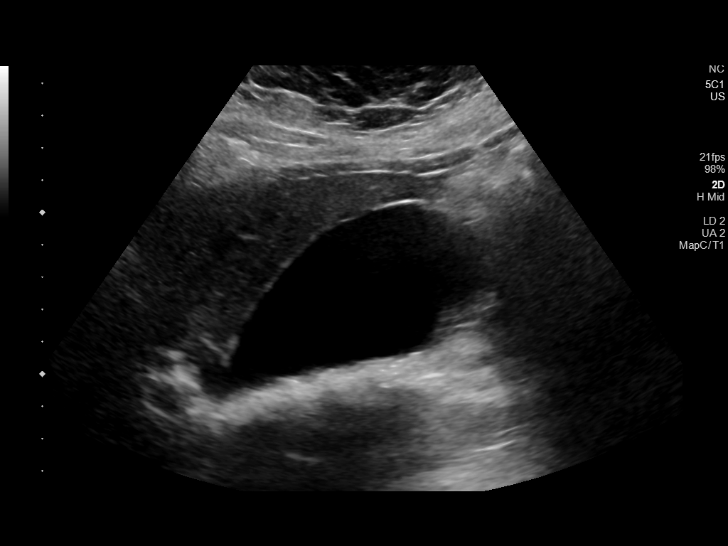
[im 5/56]
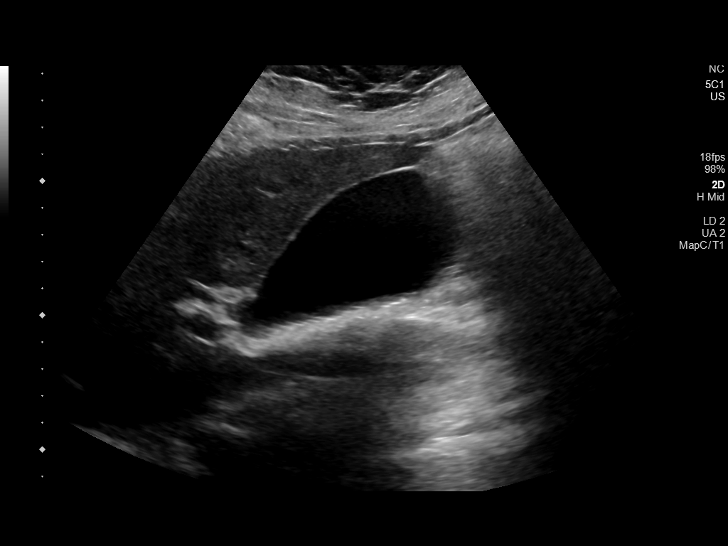
[im 10/56]
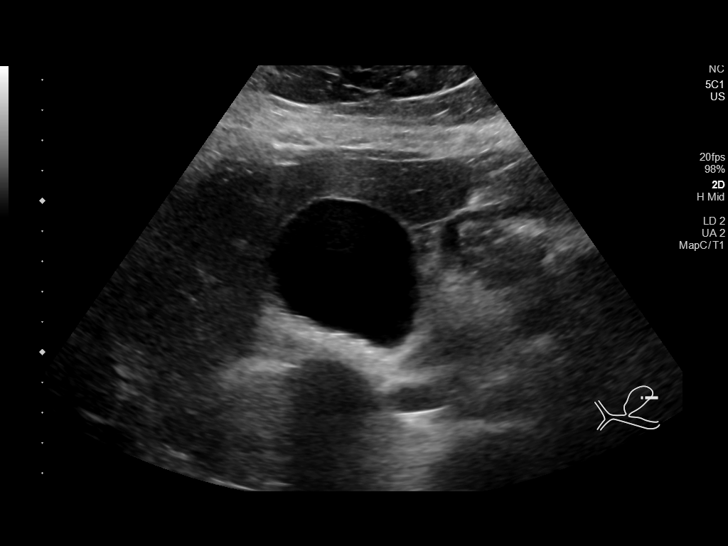
[im 14/56]
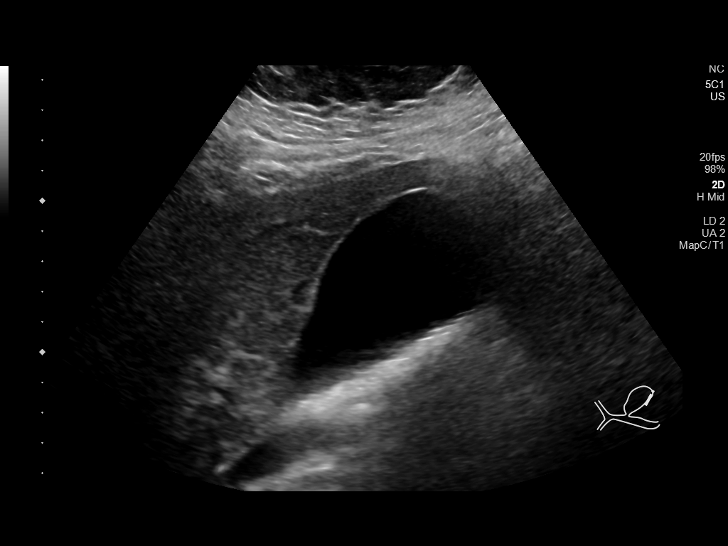
[im 19/56]
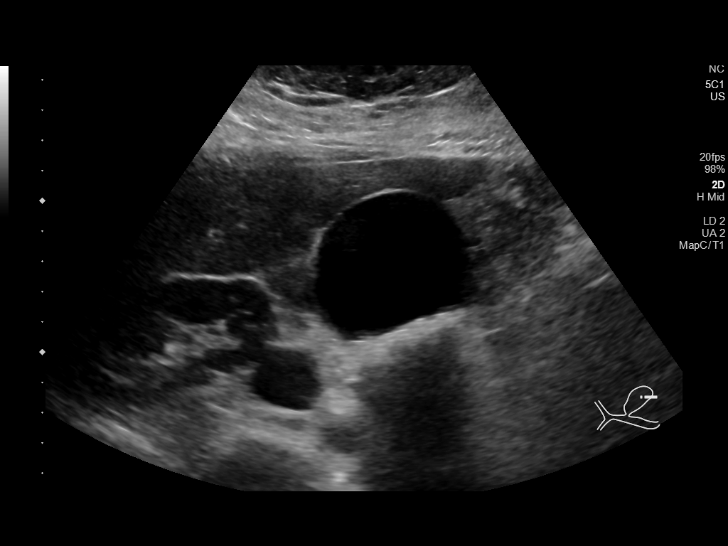
[im 21/56]
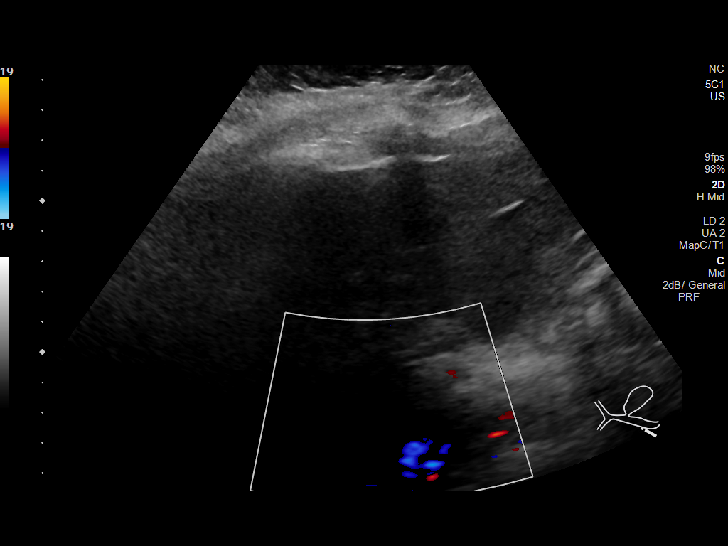
[im 26/56]
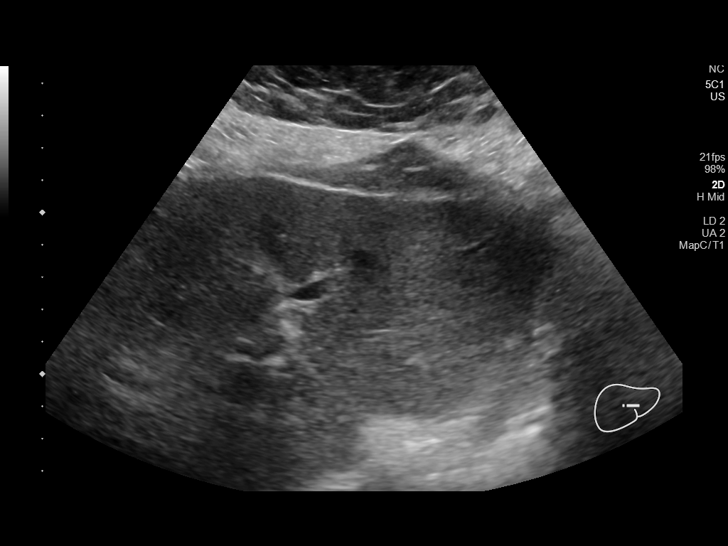
[im 30/56]
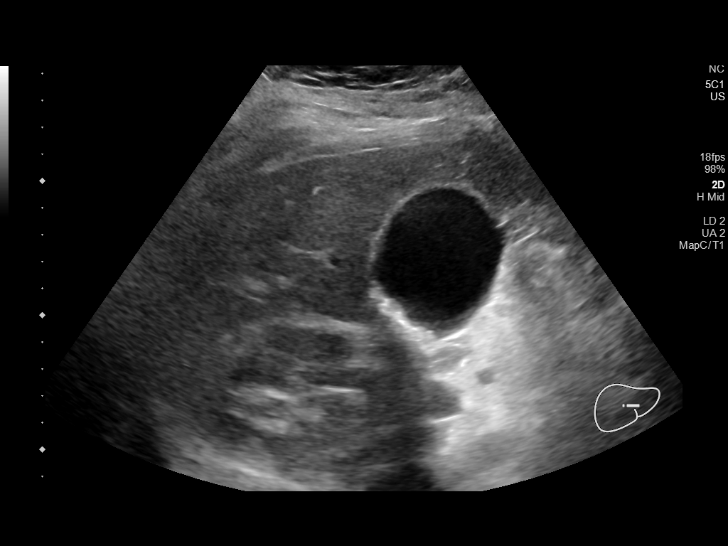
[im 35/56]
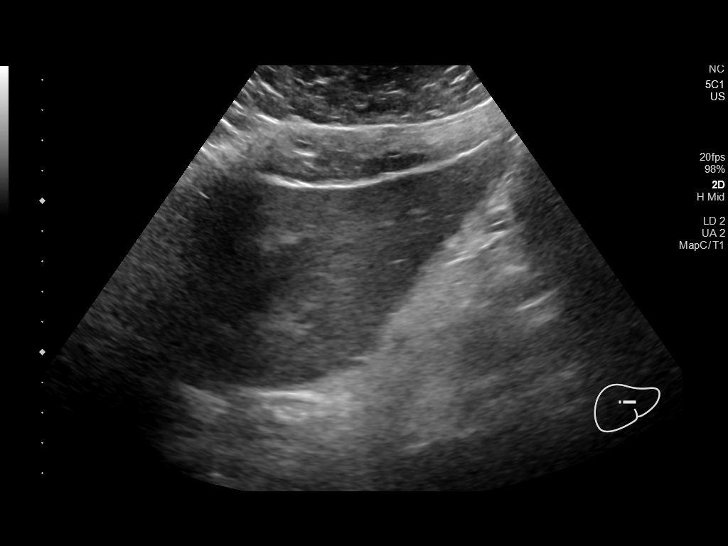
[im 37/56]
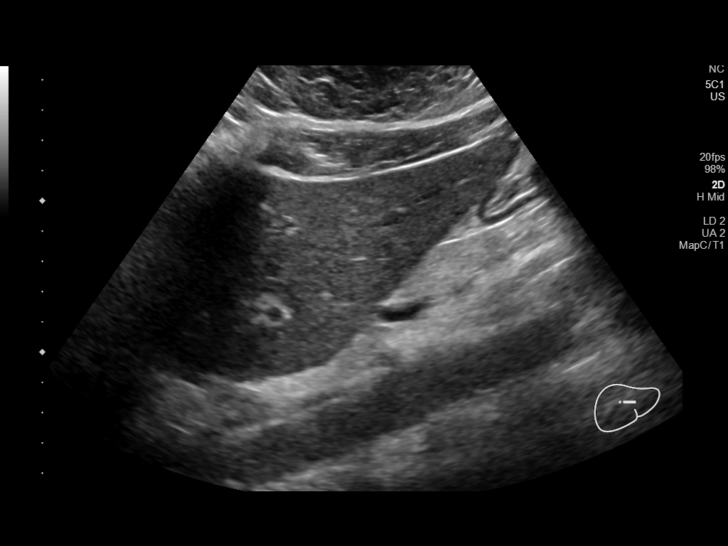
[im 42/56]
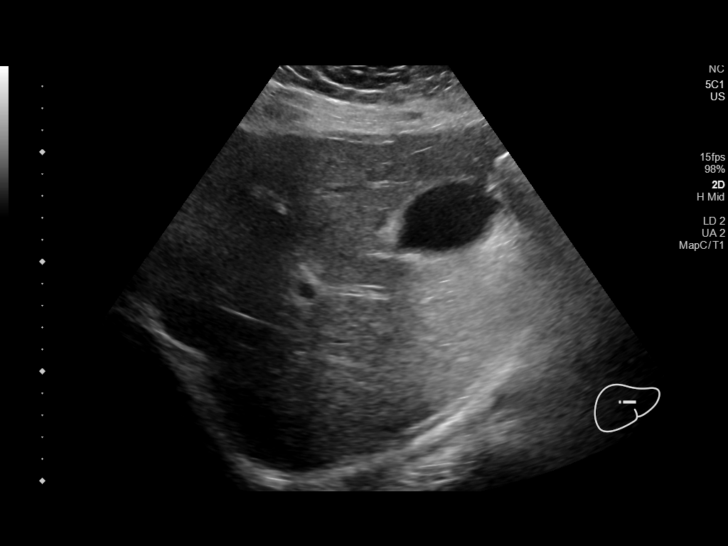
[im 46/56]
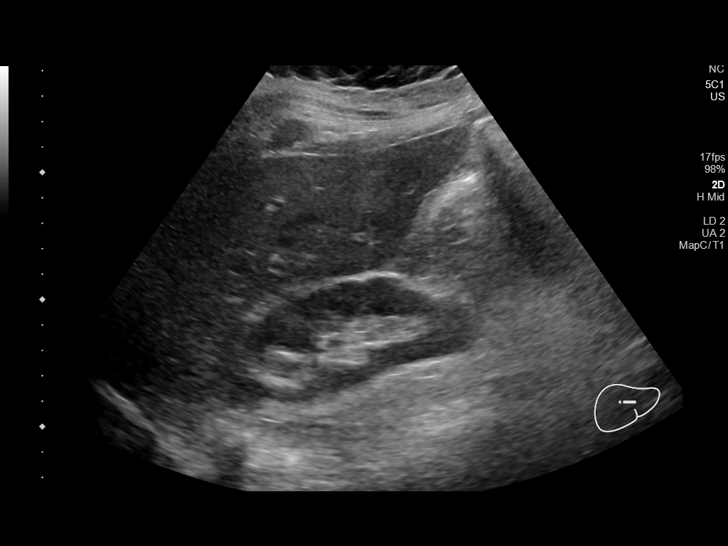
[im 51/56]
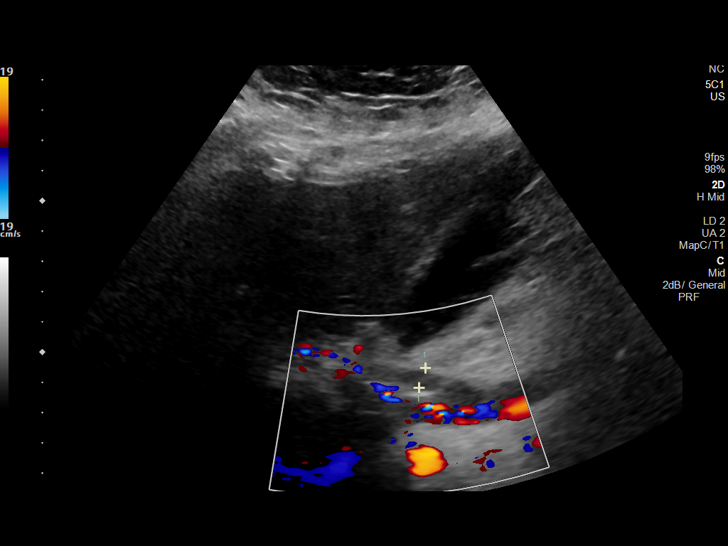
[im 56/56]
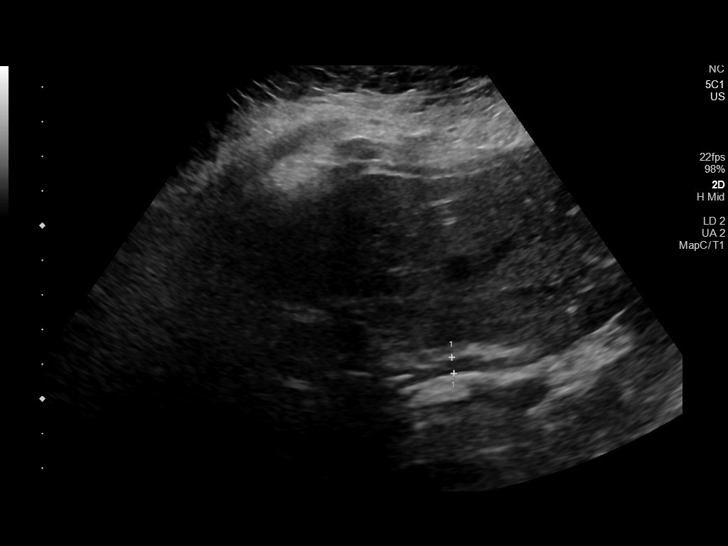

[14 of 25 positions shown; findings below may reference images not displayed]

FINDINGS: Gallbladder:

A small cluster of gravel type stones is seen within the fundus of
the gallbladder. There is no pericholecystic fluid or wall
thickening. Sonographer reports negative Murphy's sign.

Common bile duct:

Diameter: 0.4 cm.

Liver:

No focal lesion. Echogenicity is normal. Mild intrahepatic ductal
dilatation is noted. Portal vein is patent on color Doppler imaging
with normal direction of blood flow towards the liver.

Other: None.
IMPRESSION: Small cluster of small stones in the gallbladder without evidence of
cholecystitis.

Mild intrahepatic biliary ductal dilatation without cause
identified. The common bile duct is normal in diameter.

## 2022-03-12 DIAGNOSIS — Z9884 Bariatric surgery status: Secondary | ICD-10-CM | POA: Diagnosis not present

## 2022-03-12 DIAGNOSIS — R7303 Prediabetes: Secondary | ICD-10-CM | POA: Diagnosis not present

## 2022-03-12 DIAGNOSIS — E559 Vitamin D deficiency, unspecified: Secondary | ICD-10-CM | POA: Diagnosis not present

## 2022-03-12 DIAGNOSIS — Z903 Acquired absence of stomach [part of]: Secondary | ICD-10-CM | POA: Diagnosis not present

## 2022-03-12 DIAGNOSIS — E890 Postprocedural hypothyroidism: Secondary | ICD-10-CM | POA: Diagnosis not present

## 2022-03-12 DIAGNOSIS — M25511 Pain in right shoulder: Secondary | ICD-10-CM | POA: Diagnosis not present

## 2022-03-12 DIAGNOSIS — K912 Postsurgical malabsorption, not elsewhere classified: Secondary | ICD-10-CM | POA: Diagnosis not present

## 2022-03-12 DIAGNOSIS — G8929 Other chronic pain: Secondary | ICD-10-CM | POA: Diagnosis not present

## 2022-03-25 ENCOUNTER — Ambulatory Visit: Payer: Medicaid Other | Admitting: Podiatry

## 2022-04-02 ENCOUNTER — Ambulatory Visit: Payer: Medicaid Other | Admitting: Podiatry

## 2022-04-02 DIAGNOSIS — L600 Ingrowing nail: Secondary | ICD-10-CM

## 2022-04-02 NOTE — Progress Notes (Signed)
Subjective:  Patient ID: Lannie Fields, female    DOB: 02/02/1986,  MRN: [id number]  Chief Complaint  Patient presents with   Nail Problem    Nail fungus     37 y.o. female presents with the above complaint.  Patient presents with left hallux nail dystrophy.  Patient states been present for quite some time she would like to have it removed she would like to make it permanent she has not seen anyone as prior to seeing me for this.  Denies any other acute complaints she has failed all fungal treatment to bring down the nail growth.   Review of Systems: Negative except as noted in the HPI. Denies N/V/F/Ch.  Past Medical History:  Diagnosis Date   Cholecystitis with cholelithiasis 01/24/2019   Gallstones    Medical history non-contributory     Current Outpatient Medications:    doxycycline (VIBRA-TABS) 100 MG tablet, Take 1 tablet (100 mg total) by mouth 2 (two) times daily., Disp: 20 tablet, Rfl: 0   acetaminophen (TYLENOL) 325 MG tablet, Take by mouth., Disp: , Rfl:    Cholecalciferol (VITAMIN D3) 1.25 MG (50000 UT) CAPS, Take by mouth., Disp: , Rfl:    dolutegravir (TIVICAY) 50 MG tablet, Take 1 tablet (50 mg total) by mouth daily., Disp: 30 tablet, Rfl: 0   emtricitabine-tenofovir (TRUVADA) 200-300 MG tablet, Take 1 tablet by mouth daily., Disp: 30 tablet, Rfl: 0   ipratropium (ATROVENT) 0.03 % nasal spray, Place 2 sprays into both nostrils 4 (four) times daily., Disp: , Rfl:    loratadine (CLARITIN) 10 MG tablet, Take 10 mg by mouth daily., Disp: , Rfl:    metoCLOPramide (REGLAN) 10 MG tablet, Take one tab the morning of surgery then one every 6-8 hours as needed for nausea (after surgery)., Disp: , Rfl:    omeprazole (PRILOSEC) 40 MG capsule, Take 40 mg by mouth daily., Disp: , Rfl:    ursodiol (ACTIGALL) 250 MG tablet, Take 250 mg by mouth 2 (two) times daily., Disp: , Rfl:   Social History   Tobacco Use  Smoking Status Never  Smokeless Tobacco Never    Allergies   Allergen Reactions   Flagyl [Metronidazole] Hives   Septra [Sulfamethoxazole-Trimethoprim] Hives   Objective:  There were no vitals filed for this visit. There is no height or weight on file to calculate BMI. Constitutional Well developed. Well nourished.  Vascular Dorsalis pedis pulses palpable bilaterally. Posterior tibial pulses palpable bilaterally. Capillary refill normal to all digits.  No cyanosis or clubbing noted. Pedal hair growth normal.  Neurologic Normal speech. Oriented to person, place, and time. Epicritic sensation to light touch grossly present bilaterally.  Dermatologic Pain on palpation of the entire/total nail on 1st digit of the left No other open wounds. No skin lesions.  Orthopedic: Normal joint ROM without pain or crepitus bilaterally. No visible deformities. No bony tenderness.   Radiographs: None Assessment:   1. Ingrown left big toenail    Plan:  Patient was evaluated and treated and all questions answered.  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 Consent reviewed and signed by patient. -Entire/total nail excised. See procedure note. -Educated on post-procedure care including soaking. Written instructions provided and reviewed. -Patient to follow up in 2 weeks for nail check.  Procedure: Excision of entire/total nail with phenol matricectomy Location: Left 1st toe digit Anesthesia: Lidocaine 1% plain; 1.5 mL and Marcaine 0.5% plain; 1.5 mL, digital  block. Skin Prep: Betadine. Dressing: Silvadene; telfa; dry, sterile, compression dressing. Technique: Following skin prep, the toe was exsanguinated and a tourniquet was secured at the base of the toe. The affected nail border was freed and excised.  Phenol matricectomy was performed in standard technique the tourniquet was then removed and sterile dressing applied. Disposition: Patient  tolerated procedure well. Patient to return in 2 weeks for follow-up.   No follow-ups on file.

## 2022-04-07 ENCOUNTER — Telehealth: Payer: Self-pay | Admitting: *Deleted

## 2022-04-07 ENCOUNTER — Other Ambulatory Visit: Payer: Self-pay | Admitting: Podiatry

## 2022-04-07 MED ORDER — DOXYCYCLINE HYCLATE 100 MG PO TABS: 100.0000 mg | ORAL_TABLET | Freq: Two times a day (BID) | ORAL | 0 refills | Status: DC

## 2022-04-07 NOTE — Telephone Encounter (Signed)
Patient is calling because she has been soaking the post procedural  toe twice daily but has noticed pus around the nailbed,aching all the times esp w/ pressure,please advise.

## 2022-04-07 NOTE — Telephone Encounter (Signed)
Patient updated thru voice message.

## 2022-04-09 ENCOUNTER — Telehealth: Payer: Self-pay | Admitting: *Deleted

## 2022-04-09 NOTE — Telephone Encounter (Signed)
Patient is calling because toe is still warm to touch,swelling and unable to put her shoes on,sharp pains coming from the toe, numbness of the other toes. Explained to patient to give the antibiotic time to get into her system,in meantime elevate,rest and call back in a few days if it has not improved

## 2022-04-22 ENCOUNTER — Ambulatory Visit: Payer: 59 | Admitting: Podiatry

## 2022-07-15 DIAGNOSIS — G43001 Migraine without aura, not intractable, with status migrainosus: Secondary | ICD-10-CM | POA: Diagnosis not present

## 2023-08-06 ENCOUNTER — Other Ambulatory Visit (HOSPITAL_COMMUNITY)
Admission: RE | Admit: 2023-08-06 | Discharge: 2023-08-06 | Disposition: A | Discharge status: Home / Self Care | Source: Ambulatory Visit | Attending: Obstetrics and Gynecology | Admitting: Obstetrics and Gynecology

## 2023-08-06 ENCOUNTER — Encounter: Payer: Self-pay | Admitting: Obstetrics and Gynecology

## 2023-08-06 ENCOUNTER — Ambulatory Visit (INDEPENDENT_AMBULATORY_CARE_PROVIDER_SITE_OTHER): Admitting: Obstetrics and Gynecology

## 2023-08-06 VITALS — BP 112/64 | HR 70 | Resp 16 | Ht 64.25 in | Wt 232.0 lb

## 2023-08-06 DIAGNOSIS — Z8742 Personal history of other diseases of the female genital tract: Secondary | ICD-10-CM | POA: Diagnosis present

## 2023-08-06 DIAGNOSIS — Z1331 Encounter for screening for depression: Secondary | ICD-10-CM | POA: Diagnosis not present

## 2023-08-06 DIAGNOSIS — Z01419 Encounter for gynecological examination (general) (routine) without abnormal findings: Secondary | ICD-10-CM | POA: Insufficient documentation

## 2023-08-06 DIAGNOSIS — Z113 Encounter for screening for infections with a predominantly sexual mode of transmission: Secondary | ICD-10-CM

## 2023-08-06 NOTE — Progress Notes (Signed)
 38 y.o. y.o. female here for new annual exam. No LMP recorded. (Menstrual status: IUD).    Mirena IUD since 2020 denies any periods and is pleased with it Last pap ?2019 Reports abnormal pap smears Desires STI testing today MMG: to begin in October Colonoscopy: to begin at age 89 Gardesil: states she completed Body mass index is 39.51 kg/m.     08/06/2023   10:25 AM  Depression screen PHQ 2/9  Decreased Interest 0  Down, Depressed, Hopeless 0  PHQ - 2 Score 0    Blood pressure 112/64, pulse 70, resp. rate 16, height 5' 4.25" (1.632 m), weight 232 lb (105.2 kg).     Component Value Date/Time   DIAGPAP  10/29/2017 0000    NEGATIVE FOR INTRAEPITHELIAL LESIONS OR MALIGNANCY.   DIAGPAP  10/29/2017 0000    FUNGAL ORGANISMS PRESENT CONSISTENT WITH CANDIDA SPP.   ADEQPAP  10/29/2017 0000    Satisfactory for evaluation. The absence of an endocervical / transformation zone component is not uncommon in pregnant patients.    GYN HISTORY:    Component Value Date/Time   DIAGPAP  10/29/2017 0000    NEGATIVE FOR INTRAEPITHELIAL LESIONS OR MALIGNANCY.   DIAGPAP  10/29/2017 0000    FUNGAL ORGANISMS PRESENT CONSISTENT WITH CANDIDA SPP.   ADEQPAP  10/29/2017 0000    Satisfactory for evaluation. The absence of an endocervical / transformation zone component is not uncommon in pregnant patients.    OB History  Gravida Para Term Preterm AB Living  3 2 2  1 2   SAB IAB Ectopic Multiple Live Births   1  0 2    # Outcome Date GA Lbr Len/2nd Weight Sex Type Anes PTL Lv  3 Term 05/06/18 [redacted]w[redacted]d  8 lb 3.4 oz (3.725 kg) F CS-LTranv EPI  LIV  2 Term 05/27/06 [redacted]w[redacted]d  6 lb 7 oz (2.92 kg) M Vag-Spont EPI N LIV  1 IAB 2006            Past Medical History:  Diagnosis Date   Abnormal Pap smear of cervix    Anemia    Anxiety    Cholecystitis with cholelithiasis 01/24/2019   Depression    Gallstones    Medical history non-contributory    Migraines     Past Surgical History:   Procedure Laterality Date   CESAREAN SECTION N/A 05/06/2018   Procedure: CESAREAN SECTION;  Surgeon: Atlas Blank, DO;  Location: [facility] ORS;  Service: Obstetrics;  Laterality: N/A;   CHOLECYSTECTOMY  01/24/2019   CHOLECYSTECTOMY N/A 01/24/2019   Procedure: LAPAROSCOPIC CHOLECYSTECTOMY WITH ATTEMPTED INTRAOPERATIVE CHOLANGIOGRAM;  Surgeon: Boyce Byes, MD;  Location: [facility] OR;  Service: General;  Laterality: N/A;   DILATION AND CURETTAGE OF UTERUS     GASTRIC BYPASS     THYROIDECTOMY, PARTIAL     WISDOM TOOTH EXTRACTION      Current Outpatient Medications on File Prior to Visit  Medication Sig Dispense Refill   ibuprofen  (ADVIL ) 800 MG tablet Take 800 mg by mouth every 8 (eight) hours as needed.     levonorgestrel (MIRENA, 52 MG,) 20 MCG/DAY IUD Take 1 device by intrauterine route.     montelukast (SINGULAIR) 10 MG tablet Take 10 mg by mouth.     rizatriptan (MAXALT) 10 MG tablet Take 10 mg by mouth.     topiramate (TOPAMAX) 25 MG tablet Take 25 mg by mouth.     acetaminophen  (TYLENOL ) 325 MG tablet Take by mouth.     Cholecalciferol (  VITAMIN D3) 1.25 MG (50000 UT) CAPS Take by mouth.     Multiple Vitamin (MULTIVITAMIN) tablet Take 1 tablet by mouth.     No current facility-administered medications on file prior to visit.    Social History   Socioeconomic History   Marital status: Single    Spouse name: Not on file   Number of children: Not on file   Years of education: Not on file   Highest education level: 12th grade  Occupational History   Not on file  Tobacco Use   Smoking status: Never   Smokeless tobacco: Never  Vaping Use   Vaping status: Never Used  Substance and Sexual Activity   Alcohol use: Yes    Comment: occ   Drug use: Not Currently   Sexual activity: Not Currently    Birth control/protection: I.U.D.    Comment: Mirena IUD inserted 2020  Other Topics Concern   Not on file  Social History Narrative   Not on file   Social Drivers of Health    Financial Resource Strain: Low Risk  (04/14/2023)   Received from [facility]   Overall Financial Resource Strain (CARDIA)    Difficulty of Paying Living Expenses: Not very hard  Food Insecurity: No Food Insecurity (04/14/2023)   Received from [facility]   Hunger Vital Sign    Worried About Running Out of Food in the Last Year: Never true    Ran Out of Food in the Last Year: Never true  Transportation Needs: No Transportation Needs (04/14/2023)   Received from [facility] - Transportation    Lack of Transportation (Medical): No    Lack of Transportation (Non-Medical): No  Physical Activity: Unknown (04/14/2023)   Received from [facility]   Exercise Vital Sign    Days of Exercise per Week: 0 days    Minutes of Exercise per Session: Not on file  Stress: No Stress Concern Present (04/14/2023)   Received from [facility] - Occupational Stress Questionnaire    Feeling of Stress : Only a little  Social Connections: Moderately Integrated (04/14/2023)   Received from [facility]   Social Network    How would you rate your social network (family, work, friends)?: Adequate participation with social networks  Intimate Partner Violence: Not At Risk (04/14/2023)   Received from [facility]   HITS    Over the last 12 months how often did your partner physically hurt you?: Never    Over the last 12 months how often did your partner insult you or talk down to you?: Never    Over the last 12 months how often did your partner threaten you with physical harm?: Never    Over the last 12 months how often did your partner scream or curse at you?: Never    Family History  Problem Relation Age of Onset   Hypertension Mother    Hypertension Father    Diabetes Maternal Grandmother    Diabetes Maternal Grandfather    Diabetes Paternal Grandmother    Kidney disease Paternal Grandmother    Diabetes Paternal Grandfather      Allergies   Allergen Reactions   Flagyl  [Metronidazole ] Hives   Septra  [Sulfamethoxazole -Trimethoprim ] Hives      Patient's last menstrual period was No LMP recorded. (Menstrual status: IUD)..             Review of Systems Alls systems reviewed and are negative.  Physical Exam Constitutional:      Appearance: Normal appearance.  Genitourinary:     Vulva and urethral meatus normal.     No lesions in the vagina.     Genitourinary Comments: Strings lowered out of endocervix with qtip and cytobrush     Right Labia: No rash, lesions or skin changes.    Left Labia: No lesions, skin changes or rash.    No vaginal discharge or tenderness.     No vaginal prolapse present.    No vaginal atrophy present.     Right Adnexa: not tender, not palpable and no mass present.    Left Adnexa: not tender, not palpable and no mass present.    No cervical motion tenderness or discharge.     IUD strings visualized.     Uterus is not enlarged, tender or irregular.  Breasts:    Right: Normal.     Left: Normal.  HENT:     Head: Normocephalic.  Neck:     Thyroid : No thyroid  mass, thyromegaly or thyroid  tenderness.  Cardiovascular:     Rate and Rhythm: Normal rate and regular rhythm.     Heart sounds: Normal heart sounds, S1 normal and S2 normal.  Pulmonary:     Effort: Pulmonary effort is normal.     Breath sounds: Normal breath sounds and air entry.  Abdominal:     General: There is no distension.     Palpations: Abdomen is soft. There is no mass.     Tenderness: There is no abdominal tenderness. There is no guarding or rebound.  Musculoskeletal:        General: Normal range of motion.     Cervical back: Full passive range of motion without pain, normal range of motion and neck supple. No tenderness.     Right lower leg: No edema.     Left lower leg: No edema.  Neurological:     Mental Status: She is alert.  Skin:    General: Skin is warm.  Psychiatric:        Mood and Affect: Mood normal.         Behavior: Behavior normal.        Thought Content: Thought content normal.  Vitals and nursing note reviewed. Exam conducted with a chaperone present.       A:         Well Woman GYN exam                             P:        Pap smear collected today Encouraged annual mammogram screening Colon cancer screening not indicated DXA not indicated Labs and immunizations ordered today along with STI testing Discussed breast self exams Encouraged healthy lifestyle practices   No follow-ups on file.  Reinaldo Caras

## 2023-08-07 LAB — SURESWAB® ADVANCED VAGINITIS PLUS,TMA
C. trachomatis RNA, TMA: NOT DETECTED
CANDIDA SPECIES: NOT DETECTED
Candida glabrata: NOT DETECTED
N. gonorrhoeae RNA, TMA: NOT DETECTED
SURESWAB(R) ADV BACTERIAL VAGINOSIS(BV),TMA: POSITIVE — AB
TRICHOMONAS VAGINALIS (TV),TMA: NOT DETECTED

## 2023-08-09 ENCOUNTER — Ambulatory Visit: Payer: Self-pay | Admitting: Obstetrics and Gynecology

## 2023-08-10 ENCOUNTER — Other Ambulatory Visit: Payer: Self-pay

## 2023-08-10 LAB — VITAMIN D 25 HYDROXY (VIT D DEFICIENCY, FRACTURES): Vit D, 25-Hydroxy: 20 ng/mL — ABNORMAL LOW (ref 30–100)

## 2023-08-10 LAB — HEMOGLOBIN A1C
Hgb A1c MFr Bld: 5.6 % (ref <5.7)
Mean Plasma Glucose: 114 mg/dL
eAG (mmol/L): 6.3 mmol/L

## 2023-08-10 LAB — CBC
HCT: 40.1 % (ref 35.0–45.0)
Hemoglobin: 13.1 g/dL (ref 11.7–15.5)
MCH: 29.3 pg (ref 27.0–33.0)
MCHC: 32.7 g/dL (ref 32.0–36.0)
MCV: 89.7 fL (ref 80.0–100.0)
MPV: 10.5 fL (ref 7.5–12.5)
Platelets: 203 10*3/uL (ref 140–400)
RBC: 4.47 10*6/uL (ref 3.80–5.10)
RDW: 13.1 % (ref 11.0–15.0)
WBC: 5.6 10*3/uL (ref 3.8–10.8)

## 2023-08-10 LAB — RPR: RPR Ser Ql: NONREACTIVE

## 2023-08-10 LAB — HIV ANTIBODY (ROUTINE TESTING W REFLEX): HIV 1&2 Ab, 4th Generation: NONREACTIVE

## 2023-08-10 LAB — HEPATITIS C ANTIBODY: Hepatitis C Ab: NONREACTIVE

## 2023-08-10 LAB — TSH: TSH: 1.56 m[IU]/L

## 2023-08-10 LAB — HEPATITIS B SURFACE ANTIGEN: Hepatitis B Surface Ag: NONREACTIVE

## 2023-08-10 MED ORDER — CLINDAMYCIN PHOSPHATE (1 DOSE) 2 % VA CREA: 1.0000 | TOPICAL_CREAM | Freq: Once | VAGINAL | 0 refills | Status: AC

## 2023-08-16 LAB — CYTOLOGY - PAP
Comment: NEGATIVE
Diagnosis: UNDETERMINED — AB
High risk HPV: NEGATIVE

## 2023-09-20 ENCOUNTER — Encounter (HOSPITAL_BASED_OUTPATIENT_CLINIC_OR_DEPARTMENT_OTHER): Admitting: Certified Nurse Midwife

## 2023-12-29 ENCOUNTER — Ambulatory Visit

## 2024-01-04 ENCOUNTER — Ambulatory Visit
Admission: RE | Admit: 2024-01-04 | Discharge: 2024-01-04 | Disposition: A | Discharge status: Home / Self Care | Source: Ambulatory Visit | Attending: Obstetrics and Gynecology | Admitting: Obstetrics and Gynecology

## 2024-01-04 DIAGNOSIS — Z01419 Encounter for gynecological examination (general) (routine) without abnormal findings: Secondary | ICD-10-CM

## 2024-01-10 ENCOUNTER — Emergency Department (HOSPITAL_BASED_OUTPATIENT_CLINIC_OR_DEPARTMENT_OTHER)
Admission: EM | Admit: 2024-01-10 | Discharge: 2024-01-10 | Disposition: A | Discharge status: Home / Self Care | Attending: Emergency Medicine | Admitting: Emergency Medicine

## 2024-01-10 ENCOUNTER — Encounter (HOSPITAL_BASED_OUTPATIENT_CLINIC_OR_DEPARTMENT_OTHER): Payer: Self-pay

## 2024-01-10 DIAGNOSIS — H6691 Otitis media, unspecified, right ear: Secondary | ICD-10-CM | POA: Insufficient documentation

## 2024-01-10 DIAGNOSIS — J069 Acute upper respiratory infection, unspecified: Secondary | ICD-10-CM | POA: Insufficient documentation

## 2024-01-10 DIAGNOSIS — H9201 Otalgia, right ear: Secondary | ICD-10-CM | POA: Diagnosis present

## 2024-01-10 MED ORDER — FLUTICASONE PROPIONATE 50 MCG/ACT NA SUSP: 1.0000 | Freq: Every day | NASAL | 2 refills | Status: AC

## 2024-01-10 MED ORDER — AMOXICILLIN 500 MG PO CAPS: 500.0000 mg | ORAL_CAPSULE | Freq: Two times a day (BID) | ORAL | 0 refills | Status: AC

## 2024-01-10 NOTE — ED Triage Notes (Signed)
 PT to triage 1 c/o right ear pain and cough x 1 day. PT denies fever chills and SOB. VSS NAD PT on room air.

## 2024-01-10 NOTE — ED Provider Notes (Signed)
 Dana Garcia Provider Note   CSN: [id number] Arrival date & time: 01/10/24  0424     Patient presents with: Otalgia   Dana Garcia is a 38 y.o. female.   HPI     This is a 38 year old female who presents with upper respiratory symptoms and right ear pain reports onset of symptoms yesterday.  States that she developed congestion, sore throat, cough.  She states she took Tylenol  before going to bed but then woke up with a right ear infection.  She reports multiple sick contacts.  No fevers.  Prior to Admission medications   Medication Sig Start Date End Date Taking? Authorizing Provider  amoxicillin (AMOXIL) 500 MG capsule Take 1 capsule (500 mg total) by mouth 2 (two) times daily. 01/10/24  Yes Manal Kreutzer, Charmaine FALCON, MD  fluticasone (FLONASE) 50 MCG/ACT nasal spray Place 1 spray into both nostrils daily. 01/10/24  Yes Shardea Cwynar, Charmaine FALCON, MD  acetaminophen  (TYLENOL ) 325 MG tablet Take by mouth.    [provider]  Cholecalciferol (VITAMIN D3) 1.25 MG (50000 UT) CAPS Take by mouth. 09/26/19   [provider]  ibuprofen  (ADVIL ) 800 MG tablet Take 800 mg by mouth every 8 (eight) hours as needed. 07/22/23   [provider]  levonorgestrel (MIRENA, 52 MG,) 20 MCG/DAY IUD Take 1 device by intrauterine route. 07/13/18   [provider]  montelukast (SINGULAIR) 10 MG tablet Take 10 mg by mouth. 06/30/23 12/27/23  [provider]  Multiple Vitamin (MULTIVITAMIN) tablet Take 1 tablet by mouth.    [provider]  rizatriptan (MAXALT) 10 MG tablet Take 10 mg by mouth. 02/03/23   [provider]  topiramate (TOPAMAX) 25 MG tablet Take 25 mg by mouth. 07/09/22   [provider]    Allergies: Flagyl  [metronidazole ] and Septra  [sulfamethoxazole -trimethoprim ]    Review of Systems  Constitutional:  Negative for fever.  HENT:  Positive for congestion, ear pain and sore throat.    Respiratory:  Positive for cough.   All other systems reviewed and are negative.   Updated Vital Signs BP (!) 140/95 (BP Location: Right Arm)   Pulse 66   Temp 98.3 F (36.8 C) (Oral)   Resp 17   Ht 1.626 m (5' 4)   Wt 104.8 kg   SpO2 100%   BMI 39.65 kg/m   Physical Exam Vitals and nursing note reviewed.  Constitutional:      Appearance: She is well-developed. She is obese. She is not ill-appearing.  HENT:     Head: Normocephalic and atraumatic.     Ears:     Comments: Right TM bulging and erythematous with effusion noted, left TM slightly erythematous, light reflex intact    Mouth/Throat:     Mouth: Mucous membranes are moist.     Comments: No exudate, uvula midline Eyes:     Pupils: Pupils are equal, round, and reactive to light.  Cardiovascular:     Rate and Rhythm: Normal rate and regular rhythm.     Heart sounds: Normal heart sounds.  Pulmonary:     Effort: Pulmonary effort is normal. No respiratory distress.     Breath sounds: No wheezing.  Abdominal:     Palpations: Abdomen is soft.  Musculoskeletal:     Cervical back: Neck supple.  Lymphadenopathy:     Cervical: No cervical adenopathy.  Skin:    General: Skin is warm and dry.  Neurological:     Mental Status: She is alert  and oriented to person, place, and time.  Psychiatric:        Mood and Affect: Mood normal.     (all labs ordered are listed, but only abnormal results are displayed) Labs Reviewed - No data to display  EKG: None  Radiology: No results found.   Procedures   Medications Ordered in the ED - No data to display                                  Medical Decision Making Risk Prescription drug management.   This patient presents to the ED for concern of upper respiratory symptoms, this involves an extensive number of treatment options, and is a complaint that carries with it a high risk of complications and morbidity.  I considered the following differential and admission  for this acute, potentially life threatening condition.  The differential diagnosis includes which is COVID and flu, pneumonia, otitis media  MDM:    This is a 38 year old female who presents with 2-day history of upper respiratory symptoms.  Woke up with ear pain.  Right TM is bulging with an effusion and erythema.  She is nontoxic and afebrile.  Given constellation of symptoms, highly suspect viral etiology.  Discussed with her supportive measures.  Would recommend Flonase and nasal saline initially to see if he can get the middle ear to drain.  If not improving in 48 hours or develops fevers, she can start antibiotics.  Do not feel she needs imaging as I have low suspicion for pneumonia.  (Labs, imaging, consults)  Labs: I Ordered, and personally interpreted labs.  The pertinent results include: None  Imaging Studies ordered: I ordered imaging studies including none I independently visualized and interpreted imaging. I agree with the radiologist interpretation  Additional history obtained from chart review.  External records from outside source obtained and reviewed including prior evaluations  Cardiac Monitoring: The patient was not maintained on a cardiac monitor.  If on the cardiac monitor, I personally viewed and interpreted the cardiac monitored which showed an underlying rhythm of: N/A  Reevaluation: After the interventions noted above, I reevaluated the patient and found that they have :stayed the same  Social Determinants of Health:  lives independently  Disposition: Discharge  Co morbidities that complicate the patient evaluation  Past Medical History:  Diagnosis Date   Abnormal Pap smear of cervix    Anemia    Anxiety    Cholecystitis with cholelithiasis 01/24/2019   Depression    Gallstones    Medical history non-contributory    Migraines      Medicines Meds ordered this encounter  Medications   amoxicillin (AMOXIL) 500 MG capsule    Sig: Take 1 capsule  (500 mg total) by mouth 2 (two) times daily.    Dispense:  20 capsule    Refill:  0   fluticasone (FLONASE) 50 MCG/ACT nasal spray    Sig: Place 1 spray into both nostrils daily.    Dispense:  16 g    Refill:  2    I have reviewed the patients home medicines and have made adjustments as needed  Problem List / ED Course: Problem List Items Addressed This Visit   None Visit Diagnoses       Viral URI    -  Primary     Right otitis media, unspecified otitis media type       Relevant Medications  amoxicillin (AMOXIL) 500 MG capsule                Final diagnoses:  Viral URI  Right otitis media, unspecified otitis media type    ED Discharge Orders          Ordered    amoxicillin (AMOXIL) 500 MG capsule  2 times daily        01/10/24 0511    fluticasone (FLONASE) 50 MCG/ACT nasal spray  Daily        01/10/24 0511               Bari Charmaine FALCON, MD 01/10/24 [phone]

## 2024-01-10 NOTE — Discharge Instructions (Signed)
 You were seen today for upper respiratory symptoms and ear pain.  This is likely viral in nature.  Start Flonase and nasal saline to drain the middle ear and help with congestion.  Take Tylenol  or ibuprofen  as needed for pain.  If in 2 days you are not improving or have develop fevers, you may start the antibiotic provided.

## 2024-08-08 ENCOUNTER — Ambulatory Visit: Admitting: Obstetrics and Gynecology
